# Patient Record
Sex: Female | Born: 1965 | Race: White | Hispanic: No | Marital: Married | Smoking: Former smoker
Health system: Southern US, Community
[De-identification: ages and names within clinical notes are randomized; demographics above are authoritative.]

## PROBLEM LIST (undated history)

## (undated) DIAGNOSIS — E785 Hyperlipidemia, unspecified: Secondary | ICD-10-CM

## (undated) DIAGNOSIS — I10 Essential (primary) hypertension: Secondary | ICD-10-CM

## (undated) DIAGNOSIS — Z9889 Other specified postprocedural states: Secondary | ICD-10-CM

## (undated) DIAGNOSIS — C801 Malignant (primary) neoplasm, unspecified: Secondary | ICD-10-CM

## (undated) DIAGNOSIS — C189 Malignant neoplasm of colon, unspecified: Secondary | ICD-10-CM

## (undated) DIAGNOSIS — I4891 Unspecified atrial fibrillation: Secondary | ICD-10-CM

## (undated) DIAGNOSIS — G4700 Insomnia, unspecified: Secondary | ICD-10-CM

## (undated) DIAGNOSIS — R519 Headache, unspecified: Secondary | ICD-10-CM

## (undated) DIAGNOSIS — R51 Headache: Secondary | ICD-10-CM

## (undated) DIAGNOSIS — R011 Cardiac murmur, unspecified: Secondary | ICD-10-CM

## (undated) DIAGNOSIS — F32A Depression, unspecified: Secondary | ICD-10-CM

## (undated) DIAGNOSIS — F329 Major depressive disorder, single episode, unspecified: Secondary | ICD-10-CM

## (undated) DIAGNOSIS — R112 Nausea with vomiting, unspecified: Secondary | ICD-10-CM

## (undated) HISTORY — PX: APPENDECTOMY: SHX54

## (undated) HISTORY — PX: COLON SURGERY: SHX602

## (undated) HISTORY — PX: TONSILLECTOMY: SUR1361

## (undated) HISTORY — DX: Unspecified atrial fibrillation: I48.91

## (undated) HISTORY — PX: INCISIONAL HERNIA REPAIR: SHX193

---

## 2004-06-30 ENCOUNTER — Ambulatory Visit: Payer: Self-pay | Admitting: Obstetrics and Gynecology

## 2005-07-03 ENCOUNTER — Ambulatory Visit: Payer: Self-pay | Admitting: Obstetrics and Gynecology

## 2006-07-05 ENCOUNTER — Ambulatory Visit: Payer: Self-pay | Admitting: Obstetrics and Gynecology

## 2007-07-08 ENCOUNTER — Ambulatory Visit: Payer: Self-pay | Admitting: Obstetrics and Gynecology

## 2008-07-09 ENCOUNTER — Ambulatory Visit: Payer: Self-pay | Admitting: Obstetrics and Gynecology

## 2009-07-12 ENCOUNTER — Ambulatory Visit: Payer: Self-pay | Admitting: Obstetrics and Gynecology

## 2010-07-15 ENCOUNTER — Ambulatory Visit: Payer: Self-pay | Admitting: Obstetrics and Gynecology

## 2011-07-18 ENCOUNTER — Ambulatory Visit: Payer: Self-pay | Admitting: Obstetrics and Gynecology

## 2012-08-28 ENCOUNTER — Ambulatory Visit: Payer: Self-pay | Admitting: Obstetrics and Gynecology

## 2012-11-21 ENCOUNTER — Ambulatory Visit: Payer: Self-pay | Admitting: Podiatry

## 2013-08-29 ENCOUNTER — Ambulatory Visit: Payer: Self-pay | Admitting: Obstetrics and Gynecology

## 2013-09-22 ENCOUNTER — Ambulatory Visit: Payer: Self-pay | Admitting: Gastroenterology

## 2013-09-24 LAB — PATHOLOGY REPORT

## 2013-10-24 DIAGNOSIS — IMO0002 Reserved for concepts with insufficient information to code with codable children: Secondary | ICD-10-CM | POA: Insufficient documentation

## 2013-10-24 DIAGNOSIS — C187 Malignant neoplasm of sigmoid colon: Secondary | ICD-10-CM | POA: Insufficient documentation

## 2013-10-30 DIAGNOSIS — R768 Other specified abnormal immunological findings in serum: Secondary | ICD-10-CM | POA: Insufficient documentation

## 2013-11-04 DIAGNOSIS — C187 Malignant neoplasm of sigmoid colon: Secondary | ICD-10-CM | POA: Insufficient documentation

## 2014-01-10 DIAGNOSIS — G43009 Migraine without aura, not intractable, without status migrainosus: Secondary | ICD-10-CM | POA: Insufficient documentation

## 2014-01-22 DIAGNOSIS — F321 Major depressive disorder, single episode, moderate: Secondary | ICD-10-CM | POA: Insufficient documentation

## 2014-01-23 HISTORY — PX: GASTRIC BYPASS: SHX52

## 2014-03-05 DIAGNOSIS — I493 Ventricular premature depolarization: Secondary | ICD-10-CM | POA: Insufficient documentation

## 2014-04-03 DIAGNOSIS — I10 Essential (primary) hypertension: Secondary | ICD-10-CM | POA: Insufficient documentation

## 2014-05-07 ENCOUNTER — Ambulatory Visit
Admit: 2014-05-07 | Disposition: A | Discharge status: Home / Self Care | Payer: Self-pay | Attending: Gastroenterology | Admitting: Gastroenterology

## 2014-05-15 ENCOUNTER — Ambulatory Visit
Admit: 2014-05-15 | Disposition: A | Discharge status: Home / Self Care | Payer: Self-pay | Attending: Gastroenterology | Admitting: Gastroenterology

## 2014-05-18 LAB — SURGICAL PATHOLOGY

## 2014-06-23 ENCOUNTER — Encounter
Admission: RE | Admit: 2014-06-23 | Discharge: 2014-06-23 | Disposition: A | Discharge status: Home / Self Care | Payer: BLUE CROSS/BLUE SHIELD | Source: Ambulatory Visit | Attending: Bariatrics | Admitting: Bariatrics

## 2014-06-23 DIAGNOSIS — Z01812 Encounter for preprocedural laboratory examination: Secondary | ICD-10-CM | POA: Insufficient documentation

## 2014-06-23 HISTORY — DX: Essential (primary) hypertension: I10

## 2014-06-23 HISTORY — DX: Malignant (primary) neoplasm, unspecified: C80.1

## 2014-06-23 HISTORY — DX: Cardiac murmur, unspecified: R01.1

## 2014-06-23 MED ORDER — APREPITANT 80 & 125 MG PO MISC: 125.0000 mg | Freq: Once | ORAL | Status: DC

## 2014-06-23 NOTE — Patient Instructions (Signed)
  Your procedure is scheduled on: June 30, 2014 Report to Same Day Surgery. To find out your arrival time please call [phone] between 1PM - 3PM on June 29, 2014.  Remember: Instructions that are not followed completely may result in serious medical risk, up to and including death, or upon the discretion of your surgeon and anesthesiologist your surgery may need to be rescheduled.    __x__ 1. Do not eat food or drink liquids after midnight. No gum chewing or hard candies.     __x__ 2. No Alcohol for 24 hours before or after surgery.   ____ 3. Bring all medications with you on the day of surgery if instructed.    __x__ 4. Notify your doctor if there is any change in your medical condition     (cold, fever, infections).     Do not wear jewelry, make-up, hairpins, clips or nail polish.  Do not wear lotions, powders, or perfumes. You may wear deodorant.  Do not shave 48 hours prior to surgery. Men may shave face and neck.  Do not bring valuables to the hospital.    [facility] is not responsible for any belongings or valuables.               Contacts, dentures or bridgework may not be worn into surgery.  Leave your suitcase in the car. After surgery it may be brought to your room.  For patients admitted to the hospital, discharge time is determined by your                treatment team.   Patients discharged the day of surgery will not be allowed to drive home.   Please read over the following fact sheets that you were given:   Surgical Site Infection Prevention   ____ Take these medicines the morning of surgery with A SIP OF WATER:    1. Metoprolol  2.   3.   4.  5.  6.  ____ Fleet Enema (as directed)   __x__ Use CHG Soap as directed  ____ Use inhalers on the day of surgery  ____ Stop metformin 2 days prior to surgery    ____ Take 1/2 of usual insulin dose the night before surgery and none on the morning of surgery.   ____ Stop Coumadin/Plavix/aspirin on   ____ Stop  Anti-inflammatories on    __x_ Stop supplements until after surgery.    ____ Bring C-Pap to the hospital.

## 2014-06-24 LAB — ABO/RH: ABO/RH(D): A POS

## 2014-06-29 MED ORDER — DEXTROSE 5 % IV SOLN
3.0000 g | Freq: Once | INTRAVENOUS | Status: AC
  Administered 2014-06-30: 3 g via INTRAVENOUS
  Filled 2014-06-29: qty 3000

## 2014-06-30 ENCOUNTER — Inpatient Hospital Stay: Payer: BLUE CROSS/BLUE SHIELD | Admitting: Anesthesiology

## 2014-06-30 ENCOUNTER — Encounter: Payer: Self-pay | Admitting: *Deleted

## 2014-06-30 ENCOUNTER — Inpatient Hospital Stay
Admission: RE | Admit: 2014-06-30 | Discharge: 2014-07-01 | DRG: 621 | Disposition: A | Discharge status: Home / Self Care | Payer: BLUE CROSS/BLUE SHIELD | Source: Ambulatory Visit | Attending: Bariatrics | Admitting: Bariatrics

## 2014-06-30 ENCOUNTER — Encounter
Admission: RE | Disposition: A | Discharge status: Home / Self Care | Payer: Self-pay | Source: Ambulatory Visit | Attending: Bariatrics

## 2014-06-30 DIAGNOSIS — I10 Essential (primary) hypertension: Secondary | ICD-10-CM | POA: Diagnosis present

## 2014-06-30 DIAGNOSIS — Z87891 Personal history of nicotine dependence: Secondary | ICD-10-CM

## 2014-06-30 DIAGNOSIS — Z85038 Personal history of other malignant neoplasm of large intestine: Secondary | ICD-10-CM

## 2014-06-30 HISTORY — PX: LAPAROSCOPIC GASTRIC RESTRICTIVE DUODENAL PROCEDURE (DUODENAL SWITCH): SHX6667

## 2014-06-30 LAB — TYPE AND SCREEN
ABO/RH(D): A POS
ANTIBODY SCREEN: POSITIVE
PT AG Type: POSITIVE
UNIT DIVISION: 0
Unit division: 0

## 2014-06-30 LAB — CREATININE, SERUM
Creatinine, Ser: 0.72 mg/dL (ref 0.44–1.00)
GFR calc non Af Amer: >60 mL/min (ref >60)

## 2014-06-30 SURGERY — LAPAROSCOPIC GASTRIC RESTRICTIVE DUODENAL PROCEDURE (DUODENAL SWITCH)
Anesthesia: General | Wound class: Contaminated

## 2014-06-30 MED ORDER — POTASSIUM CHLORIDE IN NACL 20-0.45 MEQ/L-% IV SOLN
INTRAVENOUS | Status: DC
  Administered 2014-06-30 – 2014-07-01 (×4): via INTRAVENOUS
  Filled 2014-06-30 (×6): qty 1000

## 2014-06-30 MED ORDER — UNJURY CHOCOLATE CLASSIC POWDER: 2.0000 [oz_av] | Freq: Four times a day (QID) | ORAL | Status: DC

## 2014-06-30 MED ORDER — UNJURY CHICKEN SOUP POWDER
2.0000 [oz_av] | Freq: Three times a day (TID) | ORAL | Status: DC
  Administered 2014-06-30: 2 [oz_av] via ORAL

## 2014-06-30 MED ORDER — MIDAZOLAM HCL 2 MG/2ML IJ SOLN
INTRAMUSCULAR | Status: DC | PRN
  Administered 2014-06-30 (×2): 1 mg via INTRAVENOUS
  Administered 2014-06-30: 2 mg via INTRAVENOUS

## 2014-06-30 MED ORDER — ONDANSETRON HCL 4 MG/2ML IJ SOLN
INTRAMUSCULAR | Status: DC | PRN
  Administered 2014-06-30: 4 mg via INTRAVENOUS

## 2014-06-30 MED ORDER — LACTATED RINGERS IV SOLN
INTRAVENOUS | Status: DC
  Administered 2014-06-30 (×2): via INTRAVENOUS

## 2014-06-30 MED ORDER — METOPROLOL TARTRATE 25 MG PO TABS
25.0000 mg | ORAL_TABLET | Freq: Two times a day (BID) | ORAL | Status: DC
  Administered 2014-07-01: 25 mg via ORAL
  Filled 2014-06-30: qty 1

## 2014-06-30 MED ORDER — ACETAMINOPHEN 10 MG/ML IV SOLN
INTRAVENOUS | Status: AC
  Filled 2014-06-30: qty 100

## 2014-06-30 MED ORDER — MORPHINE SULFATE 2 MG/ML IJ SOLN
2.0000 mg | INTRAMUSCULAR | Status: DC | PRN
  Administered 2014-06-30 – 2014-07-01 (×3): 4 mg via INTRAVENOUS
  Filled 2014-06-30: qty 2
  Filled 2014-06-30: qty 1
  Filled 2014-06-30 (×2): qty 2

## 2014-06-30 MED ORDER — ROCURONIUM BROMIDE 100 MG/10ML IV SOLN
INTRAVENOUS | Status: DC | PRN
  Administered 2014-06-30 (×2): 10 mg via INTRAVENOUS
  Administered 2014-06-30: 40 mg via INTRAVENOUS
  Administered 2014-06-30: 10 mg via INTRAVENOUS

## 2014-06-30 MED ORDER — SUGAMMADEX SODIUM 200 MG/2ML IV SOLN
INTRAVENOUS | Status: DC | PRN
  Administered 2014-06-30: 268.6 mg via INTRAVENOUS

## 2014-06-30 MED ORDER — PANTOPRAZOLE SODIUM 40 MG IV SOLR
40.0000 mg | Freq: Every day | INTRAVENOUS | Status: DC
  Administered 2014-06-30: 40 mg via INTRAVENOUS
  Filled 2014-06-30: qty 40

## 2014-06-30 MED ORDER — ACETAMINOPHEN 10 MG/ML IV SOLN
INTRAVENOUS | Status: DC | PRN
  Administered 2014-06-30: 1000 mg via INTRAVENOUS

## 2014-06-30 MED ORDER — PROPOFOL 10 MG/ML IV BOLUS
INTRAVENOUS | Status: DC | PRN
  Administered 2014-06-30: 150 mg via INTRAVENOUS
  Administered 2014-06-30: 50 mg via INTRAVENOUS

## 2014-06-30 MED ORDER — UNJURY VANILLA POWDER: 2.0000 [oz_av] | Freq: Four times a day (QID) | ORAL | Status: DC

## 2014-06-30 MED ORDER — BUPIVACAINE-EPINEPHRINE (PF) 0.25% -1:200000 IJ SOLN
INTRAMUSCULAR | Status: AC
  Filled 2014-06-30: qty 60

## 2014-06-30 MED ORDER — ENOXAPARIN SODIUM 40 MG/0.4ML ~~LOC~~ SOLN: 40.0000 mg | SUBCUTANEOUS | Status: DC

## 2014-06-30 MED ORDER — UNJURY CHICKEN SOUP POWDER: 2.0000 [oz_av] | Freq: Four times a day (QID) | ORAL | Status: DC

## 2014-06-30 MED ORDER — SODIUM CHLORIDE 0.9 % IR SOLN
Status: DC | PRN
  Administered 2014-06-30: 350 mL

## 2014-06-30 MED ORDER — BUPIVACAINE-EPINEPHRINE (PF) 0.25% -1:200000 IJ SOLN
INTRAMUSCULAR | Status: DC | PRN
  Administered 2014-06-30: 50 mL

## 2014-06-30 MED ORDER — HYDROMORPHONE HCL 1 MG/ML IJ SOLN
0.2500 mg | INTRAMUSCULAR | Status: DC | PRN
  Administered 2014-06-30 (×4): 0.25 mg via INTRAVENOUS

## 2014-06-30 MED ORDER — FENTANYL CITRATE (PF) 100 MCG/2ML IJ SOLN
25.0000 ug | INTRAMUSCULAR | Status: DC | PRN
  Administered 2014-06-30 (×4): 25 ug via INTRAVENOUS

## 2014-06-30 MED ORDER — GLYCOPYRROLATE 0.2 MG/ML IJ SOLN
INTRAMUSCULAR | Status: DC | PRN
  Administered 2014-06-30: 0.2 mg via INTRAVENOUS

## 2014-06-30 MED ORDER — HYDROMORPHONE HCL 1 MG/ML IJ SOLN
INTRAMUSCULAR | Status: AC
  Administered 2014-06-30: 0.25 mg via INTRAVENOUS
  Filled 2014-06-30: qty 1

## 2014-06-30 MED ORDER — ACETAMINOPHEN 160 MG/5ML PO SOLN: 650.0000 mg | ORAL | Status: DC | PRN

## 2014-06-30 MED ORDER — OXYCODONE HCL 5 MG/5ML PO SOLN
5.0000 mg | ORAL | Status: DC | PRN
  Administered 2014-07-01: 5 mg via ORAL
  Filled 2014-06-30: qty 5

## 2014-06-30 MED ORDER — FENTANYL CITRATE (PF) 100 MCG/2ML IJ SOLN
INTRAMUSCULAR | Status: DC | PRN
  Administered 2014-06-30: 50 ug via INTRAVENOUS
  Administered 2014-06-30: 150 ug via INTRAVENOUS
  Administered 2014-06-30: 50 ug via INTRAVENOUS

## 2014-06-30 MED ORDER — ONDANSETRON HCL 4 MG/2ML IJ SOLN: 4.0000 mg | INTRAMUSCULAR | Status: DC | PRN

## 2014-06-30 MED ORDER — FENTANYL CITRATE (PF) 100 MCG/2ML IJ SOLN
INTRAMUSCULAR | Status: AC
  Filled 2014-06-30: qty 2

## 2014-06-30 MED ORDER — DULOXETINE HCL 60 MG PO CPEP
60.0000 mg | ORAL_CAPSULE | Freq: Every day | ORAL | Status: DC
  Administered 2014-07-01: 60 mg via ORAL
  Filled 2014-06-30: qty 1

## 2014-06-30 MED ORDER — ONDANSETRON HCL 4 MG/2ML IJ SOLN: 4.0000 mg | Freq: Once | INTRAMUSCULAR | Status: DC | PRN

## 2014-06-30 MED ORDER — BUPIVACAINE HCL (PF) 0.25 % IJ SOLN
INTRAMUSCULAR | Status: AC
  Filled 2014-06-30: qty 30

## 2014-06-30 MED ORDER — ENOXAPARIN SODIUM 40 MG/0.4ML ~~LOC~~ SOLN
40.0000 mg | Freq: Two times a day (BID) | SUBCUTANEOUS | Status: DC
  Administered 2014-07-01: 40 mg via SUBCUTANEOUS
  Filled 2014-06-30: qty 0.4

## 2014-06-30 MED ORDER — ACETAMINOPHEN 160 MG/5ML PO SOLN
325.0000 mg | ORAL | Status: DC | PRN
  Filled 2014-06-30: qty 20.3

## 2014-06-30 SURGICAL SUPPLY — 46 items
APPLIER CLIP ROT 10 11.4 M/L (STAPLE)
BANDAGE ELASTIC 6 CLIP NS LF (GAUZE/BANDAGES/DRESSINGS) ×6 IMPLANT
CANISTER SUCT 1200ML W/VALVE (MISCELLANEOUS) ×3 IMPLANT
CATH TRAY 16F METER LATEX (MISCELLANEOUS) ×3 IMPLANT
CHLORAPREP W/TINT 26ML (MISCELLANEOUS) ×6 IMPLANT
CLIP APPLIE ROT 10 11.4 M/L (STAPLE) IMPLANT
DEFOGGER SCOPE WARMER CLEARIFY (MISCELLANEOUS) ×3 IMPLANT
DRAPE UTILITY 15X26 TOWEL STRL (DRAPES) ×6 IMPLANT
ENDO 360 STITCH ×3 IMPLANT
FILTER LAP SMOKE EVAC STRL (MISCELLANEOUS) ×3 IMPLANT
GLOVE BIO SURGEON STRL SZ7 (GLOVE) ×18 IMPLANT
GOWN STRL REUS W/ TWL LRG LVL3 (GOWN DISPOSABLE) ×4 IMPLANT
GOWN STRL REUS W/TWL LRG LVL3 (GOWN DISPOSABLE) ×8
GRASPER SUT TROCAR 14GX15 (MISCELLANEOUS) IMPLANT
IRRIGATION STRYKERFLOW (MISCELLANEOUS) ×1 IMPLANT
IRRIGATOR STRYKERFLOW (MISCELLANEOUS) ×3
IV NS 1000ML (IV SOLUTION) ×2
IV NS 1000ML BAXH (IV SOLUTION) ×1 IMPLANT
KIT RM TURNOVER STRD PROC AR (KITS) ×3 IMPLANT
LABEL OR SOLS (LABEL) ×3 IMPLANT
LIQUID BAND (GAUZE/BANDAGES/DRESSINGS) ×3 IMPLANT
NDL SAFETY 22GX1.5 (NEEDLE) ×3 IMPLANT
NS IRRIG 500ML POUR BTL (IV SOLUTION) ×3 IMPLANT
PACK LAP CHOLECYSTECTOMY (MISCELLANEOUS) ×3 IMPLANT
RELOAD BLUE (STAPLE) ×3 IMPLANT
RELOAD GOLD (STAPLE) ×9 IMPLANT
RELOAD GREEN (STAPLE) ×3 IMPLANT
RELOAD STAPLER 60MM BLK (STAPLE) ×1 IMPLANT
RELOAD STAPLER BLUE 60MM (STAPLE) ×2 IMPLANT
SHEARS HARMONIC ACE PLUS 45CM (MISCELLANEOUS) ×3 IMPLANT
SLEEVE ENDOPATH XCEL 5M (ENDOMECHANICALS) ×9 IMPLANT
SLEEVE GASTRECTOMY 36FR VISIGI (MISCELLANEOUS) IMPLANT
STAPLER ECHELON LONG 60 440 (INSTRUMENTS) ×3 IMPLANT
STAPLER RELOAD 60MM BLK (STAPLE) ×3
STAPLER RELOAD BLUE 60MM (STAPLE) ×6
SUT DEVICE BRAIDED 0X39 (SUTURE) IMPLANT
SUT DEVICE BRAIDED 2.0X39 (SUTURE) ×12 IMPLANT
SUT DVC ABSORB BRAID 3.0X39 (SUTURE) ×9 IMPLANT
SUT DVC VICRYL PGA 2.0X39 (SUTURE) ×3 IMPLANT
SUT MNCRL AB 4-0 PS2 18 (SUTURE) ×3 IMPLANT
SUT VIC AB 0 CT2 27 (SUTURE) ×6 IMPLANT
SYR 20CC LL (SYRINGE) ×3 IMPLANT
TROCAR BLADELESS 15MM (ENDOMECHANICALS) ×3 IMPLANT
TROCAR XCEL 12X100 BLDLESS (ENDOMECHANICALS) ×3 IMPLANT
TROCAR XCEL NON-BLD 5MMX100MML (ENDOMECHANICALS) ×3 IMPLANT
TUBING INSUFFLATOR HEATED (MISCELLANEOUS) ×3 IMPLANT

## 2014-06-30 NOTE — Progress Notes (Signed)
ANTICOAGULATION CONSULT NOTE - Initial Consult  Pharmacy Consult for Lovenox Indication: DVT and VTE prophylaxis  Allergies  Allergen Reactions  . Prednisone     depression  . Voltaren [Diclofenac Sodium] Other (See Comments)    tachycardia    Patient Measurements: Height: 5\' 4"  (162.6 cm) Weight: (!) 309 lb 6.4 oz (140.343 kg) IBW/kg (Calculated) : 54.7 Heparin Dosing Weight:   Vital Signs: Temp: 98.2 F (36.8 C) (06/07 1700) Temp Source: Oral (06/07 1700) BP: 162/85 mmHg (06/07 1700) Pulse Rate: 74 (06/07 1700)  Labs:  Recent Labs  06/30/14 1732  CREATININE 0.72    Estimated Creatinine Clearance: 118.1 mL/min (by C-G formula based on Cr of 0.72).   Medical History: Past Medical History  Diagnosis Date  . Hypertension   . Heart murmur   . Cancer     colon    Medications:  Prescriptions prior to admission  Medication Sig Dispense Refill Last Dose  . DULoxetine (CYMBALTA) 60 MG capsule Take 60 mg by mouth daily.   06/29/2014 at Unknown time  . metoprolol (LOPRESSOR) 50 MG tablet Take 50 mg by mouth 2 (two) times daily.   06/30/2014 at 0600  . montelukast (SINGULAIR) 10 MG tablet Take 10 mg by mouth daily as needed (allergies).   Past Week at Unknown time  . Multiple Vitamin (MULTIVITAMIN) tablet Take 1 tablet by mouth daily.   Unknown at Unknown  . norethindrone-ethinyl estradiol (OVCON-35,BALZIVA,BRIELLYN) 0.4-35 MG-MCG tablet Take 1 tablet by mouth daily.   06/29/2014 at Unknown time    Assessment: BMI > 40   Goal of Therapy:  DVT prophylaxis     Plan:  Lovenox 40 mg SQ Q24H originally ordered.  Will increase dose to lovenox 40 mg SQ Q12H based on BMI > 40.   Daryle Amis D 06/30/2014,6:37 PM

## 2014-06-30 NOTE — Brief Op Note (Addendum)
06/30/2014  11:27 AM  PATIENT:  Jessica Moody  49 y.o. female  PRE-OPERATIVE DIAGNOSIS:  MORBID OBESITY  POST-OPERATIVE DIAGNOSIS:  Morbid obesityPROCEDURE:  Procedure(s): LAPAROSCOPIC GASTRIC RESTRICTIVE DUODENAL PROCEDURE (DUODENAL SWITCH) (N/A)  SURGEON:  Surgeon(s) and Role:    * [name], MD - Primary  PHYSICIAN ASSISTANT: Liliane Bade, PA      ANESTHESIA:   general  EBL:  Less than 5oml  BLOOD ADMINISTERED:none  DRAINS: none   LOCAL MEDICATIONS USED:  MARCAINE     SPECIMEN:  Source of Specimen:  lateral stomach  DISPOSITION OF SPECIMEN:  PATHOLOGY  COUNTS:  YES  TOURNIQUET:  * No tourniquets in log *  DICTATION: .Note written in paper chart  PLAN OF CARE: Admit to inpatient   PATIENT DISPOSITION:  PACU - hemodynamically stable.   Delay start of Pharmacological VTE agent (>24hrs) due to surgical blood loss or risk of bleeding:minimal

## 2014-06-30 NOTE — Anesthesia Preprocedure Evaluation (Signed)
Anesthesia Evaluation  Patient identified by MRN, date of birth, ID band Patient awake    Reviewed: Allergy & Precautions, NPO status , Patient's Chart, lab work & pertinent test results, reviewed documented beta blocker date and time   History of Anesthesia Complications Negative for: history of anesthetic complications  Airway Mallampati: I  TM Distance: >3 FB Neck ROM: Full    Dental no notable dental hx.    Pulmonary neg pulmonary ROS, former smoker,  breath sounds clear to auscultation  Pulmonary exam normal       Cardiovascular Exercise Tolerance: Good hypertension, Pt. on medications and Pt. on home beta blockers Normal cardiovascular exam+ Valvular Problems/Murmurs Rhythm:Regular Rate:Normal     Neuro/Psych negative neurological ROS  negative psych ROS   GI/Hepatic negative GI ROS, Neg liver ROS,   Endo/Other  negative endocrine ROS  Renal/GU negative Renal ROS  negative genitourinary   Musculoskeletal negative musculoskeletal ROS (+)   Abdominal   Peds negative pediatric ROS (+)  Hematology negative hematology ROS (+)   Anesthesia Other Findings   Reproductive/Obstetrics negative OB ROS                             Anesthesia Physical Anesthesia Plan  ASA: III  Anesthesia Plan: General   Post-op Pain Management:    Induction: Intravenous  Airway Management Planned: Oral ETT  Additional Equipment:   Intra-op Plan:   Post-operative Plan: Extubation in OR  Informed Consent: I have reviewed the patients History and Physical, chart, labs and discussed the procedure including the risks, benefits and alternatives for the proposed anesthesia with the patient or authorized representative who has indicated his/her understanding and acceptance.   Dental advisory given  Plan Discussed with: CRNA and Surgeon  Anesthesia Plan Comments:         Anesthesia Quick  Evaluation

## 2014-06-30 NOTE — H&P (Signed)
History and physical on paper chart, no change in either history or physical 

## 2014-06-30 NOTE — Progress Notes (Addendum)
Patient ambulated around the unit times three with no shortness of breath or dizziness. Patient tolerated ambulation well.  It even relieved her pain in the left shoulder.  Jessica Moody  06/30/2014  11:34 PM

## 2014-06-30 NOTE — Transfer of Care (Signed)
Immediate Anesthesia Transfer of Care Note  Patient: Jessica Moody  Procedure(s) Performed: Procedure(s): LAPAROSCOPIC DUODENAL SWITCH WTIH BILIARY PANCREATIC DIVERSION  (N/A)  Patient Location: PACU  Anesthesia Type:General  Level of Consciousness: awake, alert  and oriented  Airway & Oxygen Therapy: Patient Spontanous Breathing and Patient connected to face mask oxygen  Post-op Assessment: Report given to RN and Post -op Vital signs reviewed and stable  Post vital signs: Reviewed and stable  Last Vitals:  Filed Vitals:   06/30/14 1434  BP: 139/71  Pulse: 84  Temp: 37.2 C  Resp:     Complications: No apparent anesthesia complications

## 2014-06-30 NOTE — Op Note (Signed)
PATIENT: Jessica REDDOCH 04-08-1965  PROCEDURE PERFORMED: laparoscopic DS with BPD PRE-OP DIAGNOSIS: MORBID OBESITY POST-OP DIAGNOSIS:Morbid obesity ESTIMATED BLOOD LOSS: Minimal SURGEON: [name]  ASSISTANT: Liliane Bade, PA  PROCEDURE NOTE: The patient was brought to the operating room and placed in the supine position. General anesthesia was obtained with orotracheal intubation. Foley catheter inserted sterilely. TED hose and Thromboguards applied. A foot board applied at the enatd of the operative bed. The chest and abdomen were sterilely prepped and draped. A 5 mm Optiview trocar introduced under direct visualization in the left upper quadrant of the abdomen. Pneumoperitoneum obtained. Four additional trocars introduced across the upper abdomen. The cecum and distal ileum identified. The small bowel was then followed approximate 300 cm, at which point it was secured to the gastrocolic ligament. A Nathanson liver retractor was introduced followed by elevation of the left lobe of the liver. . The patient then had identification and marking of the pylorus. Beginning approximately 4 cm proximal to this there was division of vascular pedicles along the greater curvature of the stomach, this was extended cephalad over a distance of approximately 8 cm. Creation of a gastric sleeve effect was then initiated. The patient had a series of GI staple firings placed creating a medially based gastric tube effect. The first firing was a black load stapler placed in a relative transverse direction as was the next gold load firing. These were positioned in a way to avoid narrowing in the region of the incisura. Next a more vertical line of staples was placed parallel to the lesser curvature and ultimately brought out just lateral to the angle of His. As this was done the 32 [place] ViSiGi device which was placed in the antral region was gradually withdrawn to helped create a consistent tubular effect. The lateral  stomach then freed from the gastrocolic and gastrosplenic ligament and removed via the right upper quadrant 12 mm trocar site. The residual vascular pedicles along the lower inner curve of the duodenum bulb were divided by use of the Harmonic scalpel, the peritoneum being scored immediately before this and also the lateral peritoneum also scored. Blunt dissection behind the duodenum divided by use of the Harmonic scalpel. This was ultimately brought out lateral to the duodenum, approximately 3 cm inferior to the pylorus. Because of small bowel bleeding that was difficult to expose it was decided to complete mobilization of the inner curve of the lower stomach and most proximal aspect of the duodenum. This was again done with use of the Harmonic scalpel, a small bleeding point controlled by application of a 5 mm clip applier. The patient then had a blue load stapler introduced across the duodenum again approximately 3 cm inferior to the pylorus. This was used to divide the duodenum with excellent hemostasis. Several residual vascular pedicles on the lateral aspect of the duodenum divided, allowing this to be brought more to the area of the midline. The patient then had an ileal-duodenal anastomosis constructed. This was done with securing seromuscular layers of the distal duodenal staple line area and the antimesenteric border of the ileum. Enterotomies then made on the anterior aspect of the duodenum and opposing portion of the ileum. A full-thickness running 3-0 Polysorb suture used to complete the anastomosis, the suture line then reinforced anteriorly with an additional running 2-0 Polysorb suture. The intestine was occluded distally and insufflation of the gastric tube in area of anastomosis performed. A saline bath performed, no air leak identified. At this point the [place]  device was withdrawn.  The jejunum immediate proximal to the duodenal anastomosis was divided with white load gia stapler along with  partial division of mesentery with harmonic scalpel. The alimentary limb was followed distally 50 cm at which point a jejunal jejunal anastomosis created between the new biliary limb and the common channel. This was created by an enterotomy on the antimesenteric margin of each portions of bowel followed by a white load staple firing at 35 mm to creat a common lumen. The resulting enterotomy was closed with a white load staple. Antitorsion sutures placed distal to the anastomosis and the mesenteric window closed with 2.0 surgidec suture.A portion of the divided gastrocolic ligament was secured to the lateral margins of the gastric sleeve to assure a consistent, somewhat rounded entry into the distal stomach. At this point the fascia and peritoneum of the 12 mm trocar site was closed with 0 Vicryl suture as passed by a needle suture passing system under direct visualization. The pneumoperitoneum relieved. The trocars removed. The wounds injected with 0.25% Marcaine and closed with 4-0 Monocryl in the dermis followed by Dermabond.

## 2014-06-30 NOTE — Anesthesia Postprocedure Evaluation (Signed)
  Anesthesia Post-op Note  Patient: Jessica Moody  Procedure(s) Performed: Procedure(s): LAPAROSCOPIC DUODENAL SWITCH WTIH BILIARY PANCREATIC DIVERSION  (N/A)  Anesthesia type:General  Patient location: PACU  Post pain: Pain level controlled  Post assessment: Post-op Vital signs reviewed, Patient's Cardiovascular Status Stable, Respiratory Function Stable, Patent Airway and No signs of Nausea or vomiting  Post vital signs: Reviewed and stable  Last Vitals:  Filed Vitals:   06/30/14 1434  BP: 139/71  Pulse: 84  Temp: 37.2 C  Resp:     Level of consciousness: awake, alert  and patient cooperative  Complications: No apparent anesthesia complications  Sugammedex used, pt counseled on need for barrier contraceptives for next two weeks

## 2014-06-30 NOTE — Interval H&P Note (Signed)
History and Physical Interval Note:  06/30/2014 10:54 AM  Jessica Moody  has presented today for surgery, with the diagnosis of MORBID OBESITY  The various methods of treatment have been discussed with the patient and family. After consideration of risks, benefits and other options for treatment, the patient has consented to  Procedure(s): LAPAROSCOPIC GASTRIC RESTRICTIVE DUODENAL PROCEDURE (DUODENAL SWITCH) (N/A) as a surgical intervention .  The patient's history has been reviewed, patient examined, no change in status, stable for surgery.  I have reviewed the patient's chart and labs.  Questions were answered to the patient's satisfaction.     [name]

## 2014-06-30 NOTE — Anesthesia Procedure Notes (Signed)
Procedure Name: Intubation Date/Time: 06/30/2014 11:17 AM Performed by: Jonna Clark Pre-anesthesia Checklist: Patient identified, Emergency Drugs available, Suction available, Patient being monitored and Timeout performed Patient Re-evaluated:Patient Re-evaluated prior to inductionOxygen Delivery Method: Circle system utilized Preoxygenation: Pre-oxygenation with 100% oxygen Intubation Type: IV induction Ventilation: Mask ventilation without difficulty Laryngoscope Size: Mac and 3 Grade View: Grade I Tube type: Oral Tube size: 7.0 mm Number of attempts: 1 Placement Confirmation: ETT inserted through vocal cords under direct vision,  positive ETCO2 and breath sounds checked- equal and bilateral Secured at: 22 cm Tube secured with: Tape Dental Injury: Teeth and Oropharynx as per pre-operative assessment

## 2014-07-01 ENCOUNTER — Encounter: Payer: Self-pay | Admitting: Bariatrics

## 2014-07-01 LAB — COMPREHENSIVE METABOLIC PANEL
ALT: 47 U/L (ref 14–54)
AST: 49 U/L — ABNORMAL HIGH (ref 15–41)
Albumin: 2.9 g/dL — ABNORMAL LOW (ref 3.5–5.0)
Alkaline Phosphatase: 46 U/L (ref 38–126)
Anion gap: 9 (ref 5–15)
BUN: 8 mg/dL (ref 6–20)
CALCIUM: 8.3 mg/dL — AB (ref 8.9–10.3)
CO2: 25 mmol/L (ref 22–32)
CREATININE: 0.59 mg/dL (ref 0.44–1.00)
Chloride: 106 mmol/L (ref 101–111)
GFR calc Af Amer: >60 mL/min (ref >60)
GFR calc non Af Amer: >60 mL/min (ref >60)
GLUCOSE: 117 mg/dL — AB (ref 65–99)
Potassium: 4.1 mmol/L (ref 3.5–5.1)
Sodium: 140 mmol/L (ref 135–145)
TOTAL PROTEIN: 6.4 g/dL — AB (ref 6.5–8.1)
Total Bilirubin: 0.5 mg/dL (ref 0.3–1.2)

## 2014-07-01 LAB — CBC WITH DIFFERENTIAL/PLATELET
Basophils Absolute: 0 10*3/uL (ref 0–0.1)
Basophils Relative: 0 %
Eosinophils Absolute: 0 10*3/uL (ref 0–0.7)
Eosinophils Relative: 0 %
HCT: 36.1 % (ref 35.0–47.0)
Hemoglobin: 12.2 g/dL (ref 12.0–16.0)
Lymphocytes Relative: 12 %
Lymphs Abs: 1.1 10*3/uL (ref 1.0–3.6)
MCH: 28.8 pg (ref 26.0–34.0)
MCHC: 33.8 g/dL (ref 32.0–36.0)
MCV: 85 fL (ref 80.0–100.0)
MONOS PCT: 5 %
Monocytes Absolute: 0.5 10*3/uL (ref 0.2–0.9)
Neutro Abs: 7.5 10*3/uL — ABNORMAL HIGH (ref 1.4–6.5)
Neutrophils Relative %: 83 %
Platelets: 298 10*3/uL (ref 150–440)
RBC: 4.25 MIL/uL (ref 3.80–5.20)
RDW: 13.3 % (ref 11.5–14.5)
WBC: 9 10*3/uL (ref 3.6–11.0)

## 2014-07-01 NOTE — Progress Notes (Signed)
Initial Nutrition Assessment  INTERVENTION:  RD consulted for nutrition education regarding inpatient bariatric surgery.   RD provided "The Liquid Diet" handout from the Bariatric Surgery Guide from the [facility] [place] [facility]. This handout previously provided to patient prior to surgery is a duplicate copy. Discussed what foods/liquids are consistent with a Clear Liquid Diet and reinforced Key Concepts such as no carbonation, no caffeine, or sugar containing beverages. Provided methods to prevent dehydration and promote protein intake, using clock and sample fluid schedule. RD encouraged follow-up with outpatient dietitian after discharge.  Teach back method used.  Expect good compliance.  NUTRITION DIAGNOSIS:  Food and nutrition knowledge related deficit related to recent bariatric surgery as evidenced by dietitian consult for nutrition education   GOAL:  Patient will be able to sip and tolerate CL within 24-48 hours  MONITOR:  Energy intake Digestive system  ASSESSMENT:  Pt s/p lap duodenal switch with biliary pancreatic diversion yesterday.   Body mass index is 53.08 kg/(m^2).   Current diet order is Bariatric Clear Liquids with unjury supplement TID, patient is consuming approximately 1-2oz every 20 mins at this time.   Labs and medications reviewed.   LOW Care Level  Jessica Moody, [place], [place] Pager [phone]

## 2014-07-01 NOTE — Progress Notes (Addendum)
Patient discharged to home as ordered. Patient instructed to continue diet as ordered. Patient already has prescriptions IV dc 'd site without S/s of infiltration or infection. Telemetry dc 'd. Family at the bedside to take patient home. Patient also given discharge instructions. Patient is alert and oriented. Ambulating without assistance.

## 2014-07-02 LAB — SURGICAL PATHOLOGY

## 2014-07-03 ENCOUNTER — Encounter: Payer: Self-pay | Admitting: Bariatrics

## 2014-07-08 LAB — POCT PREGNANCY, URINE: Preg Test, Ur: NEGATIVE

## 2014-07-11 LAB — TYPE AND SCREEN
ABO/RH(D): A POS
ANTIBODY SCREEN: POSITIVE
PT AG TYPE: POSITIVE
UNIT DIVISION: 0
UNIT DIVISION: 0

## 2014-09-02 DIAGNOSIS — Z9884 Bariatric surgery status: Secondary | ICD-10-CM | POA: Insufficient documentation

## 2014-11-30 ENCOUNTER — Ambulatory Visit: Payer: Managed Care, Other (non HMO) | Admitting: Certified Registered"

## 2014-11-30 ENCOUNTER — Encounter
Admission: RE | Disposition: A | Discharge status: Home / Self Care | Payer: Self-pay | Source: Ambulatory Visit | Attending: Gastroenterology

## 2014-11-30 ENCOUNTER — Ambulatory Visit
Admission: RE | Admit: 2014-11-30 | Discharge: 2014-11-30 | Disposition: A | Discharge status: Home / Self Care | Payer: Managed Care, Other (non HMO) | Source: Ambulatory Visit | Attending: Gastroenterology | Admitting: Gastroenterology

## 2014-11-30 DIAGNOSIS — E785 Hyperlipidemia, unspecified: Secondary | ICD-10-CM | POA: Diagnosis not present

## 2014-11-30 DIAGNOSIS — I10 Essential (primary) hypertension: Secondary | ICD-10-CM | POA: Diagnosis not present

## 2014-11-30 DIAGNOSIS — Z85038 Personal history of other malignant neoplasm of large intestine: Secondary | ICD-10-CM | POA: Diagnosis not present

## 2014-11-30 DIAGNOSIS — Z08 Encounter for follow-up examination after completed treatment for malignant neoplasm: Secondary | ICD-10-CM | POA: Diagnosis not present

## 2014-11-30 DIAGNOSIS — Z79899 Other long term (current) drug therapy: Secondary | ICD-10-CM | POA: Insufficient documentation

## 2014-11-30 DIAGNOSIS — Z833 Family history of diabetes mellitus: Secondary | ICD-10-CM | POA: Insufficient documentation

## 2014-11-30 DIAGNOSIS — Z87891 Personal history of nicotine dependence: Secondary | ICD-10-CM | POA: Insufficient documentation

## 2014-11-30 DIAGNOSIS — K6389 Other specified diseases of intestine: Secondary | ICD-10-CM | POA: Insufficient documentation

## 2014-11-30 DIAGNOSIS — F329 Major depressive disorder, single episode, unspecified: Secondary | ICD-10-CM | POA: Diagnosis not present

## 2014-11-30 DIAGNOSIS — E669 Obesity, unspecified: Secondary | ICD-10-CM | POA: Diagnosis not present

## 2014-11-30 DIAGNOSIS — Z8249 Family history of ischemic heart disease and other diseases of the circulatory system: Secondary | ICD-10-CM | POA: Insufficient documentation

## 2014-11-30 HISTORY — DX: Headache: R51

## 2014-11-30 HISTORY — DX: Nausea with vomiting, unspecified: R11.2

## 2014-11-30 HISTORY — DX: Major depressive disorder, single episode, unspecified: F32.9

## 2014-11-30 HISTORY — DX: Insomnia, unspecified: G47.00

## 2014-11-30 HISTORY — DX: Other specified postprocedural states: Z98.890

## 2014-11-30 HISTORY — DX: Depression, unspecified: F32.A

## 2014-11-30 HISTORY — DX: Headache, unspecified: R51.9

## 2014-11-30 HISTORY — PX: COLONOSCOPY WITH PROPOFOL: SHX5780

## 2014-11-30 HISTORY — DX: Hyperlipidemia, unspecified: E78.5

## 2014-11-30 LAB — POCT PREGNANCY, URINE: PREG TEST UR: NEGATIVE

## 2014-11-30 SURGERY — COLONOSCOPY WITH PROPOFOL
Anesthesia: General

## 2014-11-30 MED ORDER — PROPOFOL 10 MG/ML IV BOLUS
INTRAVENOUS | Status: DC | PRN
  Administered 2014-11-30: 70 mg via INTRAVENOUS
  Administered 2014-11-30: 20 mg via INTRAVENOUS
  Administered 2014-11-30: 30 mg via INTRAVENOUS
  Administered 2014-11-30: 20 mg via INTRAVENOUS

## 2014-11-30 MED ORDER — SODIUM CHLORIDE 0.9 % IV SOLN
INTRAVENOUS | Status: DC
  Administered 2014-11-30: 1000 mL via INTRAVENOUS

## 2014-11-30 MED ORDER — LIDOCAINE HCL (CARDIAC) 20 MG/ML IV SOLN
INTRAVENOUS | Status: DC | PRN
  Administered 2014-11-30: 50 mg via INTRAVENOUS

## 2014-11-30 MED ORDER — PROPOFOL 500 MG/50ML IV EMUL
INTRAVENOUS | Status: DC | PRN
  Administered 2014-11-30: 120 ug/kg/min via INTRAVENOUS

## 2014-11-30 NOTE — H&P (Signed)
Primary Care Physician:  Kirk Ruths., MD  Pre-Procedure History & Physical: HPI:  Jessica Moody is a 49 y.o. female is here for an colonoscopy.   Past Medical History  Diagnosis Date  . Hypertension   . Heart murmur   . Cancer ([place])     colon  . Depression   . Gastritis   . Headache   . PONV (postoperative nausea and vomiting)   . Obesity   . Insomnia   . Hyperlipemia     Past Surgical History  Procedure Laterality Date  . Colon surgery    . Tonsillectomy    . Appendectomy    . Cesarean section N/A     C-Section X 2  . Laparoscopic gastric restrictive duodenal procedure (duodenal switch) N/A 06/30/2014    Procedure: LAPAROSCOPIC DUODENAL SWITCH WTIH BILIARY PANCREATIC DIVERSION ;  Surgeon: [name], MD;  Location: [facility] ORS;  Service: General;  Laterality: N/A;    Prior to Admission medications   Medication Sig Start Date End Date Taking? Authorizing Provider  colestipol (COLESTID) 1 G tablet Take 1 g by mouth 2 (two) times daily.   Yes Historical Provider, MD  ferrous fumarate (HEMOCYTE - 106 MG FE) 325 (106 FE) MG TABS tablet Take 1 tablet by mouth daily.   Yes Historical Provider, MD  HYDROcodone-acetaminophen (NORCO) 7.5-325 MG tablet Take 1 tablet by mouth every 6 (six) hours as needed for moderate pain.   Yes Historical Provider, MD  Lactobacillus Acidophilus POWD 1 tablet by Does not apply route daily.   Yes Historical Provider, MD  lipase/protease/amylase (CREON) 12000 UNITS CPEP capsule Take 24,000 Units by mouth 3 (three) times daily before meals.   Yes Historical Provider, MD  omeprazole (PRILOSEC) 20 MG capsule Take 20 mg by mouth daily.   Yes Historical Provider, MD  ondansetron (ZOFRAN-ODT) 8 MG disintegrating tablet Take 8 mg by mouth every 8 (eight) hours as needed for nausea or vomiting.   Yes Historical Provider, MD  potassium chloride SA (K-DUR,KLOR-CON) 20 MEQ tablet Take 20 mEq by mouth 2 (two) times daily.   Yes Historical Provider, MD   DULoxetine (CYMBALTA) 60 MG capsule Take 60 mg by mouth daily.    Historical Provider, MD  metoprolol (LOPRESSOR) 50 MG tablet Take 50 mg by mouth 2 (two) times daily.    Historical Provider, MD  montelukast (SINGULAIR) 10 MG tablet Take 10 mg by mouth daily as needed (allergies).    Historical Provider, MD  Multiple Vitamin (MULTIVITAMIN) tablet Take 1 tablet by mouth daily.    Historical Provider, MD  norethindrone-ethinyl estradiol (OVCON-35,BALZIVA,BRIELLYN) 0.4-35 MG-MCG tablet Take 1 tablet by mouth daily.    Historical Provider, MD    Allergies as of 10/01/2014 - Review Complete 06/30/2014  Allergen Reaction Noted  . Prednisone  06/23/2014  . Voltaren [diclofenac sodium] Other (See Comments) 06/23/2014    Family History  Problem Relation Age of Onset  . Diabetes type II Mother   . Hypertension Mother   . Heart failure Mother   . Kidney disease Mother     Social History   Social History  . Marital Status: Married    Spouse Name: N/A  . Number of Children: N/A  . Years of Education: N/A   Occupational History  . Not on file.   Social History Main Topics  . Smoking status: Former Smoker -- 1.00 packs/day    Types: Cigarettes    Quit date: 08/24/1998  . Smokeless tobacco: Never Used  .  Alcohol Use: No  . Drug Use: No  . Sexual Activity: Not on file   Other Topics Concern  . Not on file   Social History Narrative     Physical Exam: BP 142/69 mmHg  Pulse 59  Temp(Src) 98 F (36.7 C) (Tympanic)  Resp 19  Ht 5\' 4"  (1.626 m)  Wt 97.977 kg (216 lb)  BMI 37.06 kg/m2  SpO2 100% General:   Alert,  pleasant and cooperative in NAD Head:  Normocephalic and atraumatic. Neck:  Supple; no masses or thyromegaly. Lungs:  Clear throughout to auscultation.    Heart:  Regular rate and rhythm. Abdomen:  Soft, nontender and nondistended. Normal bowel sounds, without guarding, and without rebound.   Neurologic:  Alert and  oriented x4;  grossly normal  neurologically.  Impression/Plan: Jessica Moody is here for an colonoscopy to be performed for personal hx colon cancer  Risks, benefits, limitations, and alternatives regarding  colonoscopy have been reviewed with the patient.  Questions have been answered.  All parties agreeable.   Josefine Class, MD  11/30/2014, 11:27 AM

## 2014-11-30 NOTE — Anesthesia Postprocedure Evaluation (Signed)
  Anesthesia Post-op Note  Patient: Jessica Moody  Procedure(s) Performed: Procedure(s): COLONOSCOPY WITH PROPOFOL (N/A)  Anesthesia type:General  Patient location: PACU  Post pain: Pain level controlled  Post assessment: Post-op Vital signs reviewed, Patient's Cardiovascular Status Stable, Respiratory Function Stable, Patent Airway and No signs of Nausea or vomiting  Post vital signs: Reviewed and stable  Last Vitals:  Filed Vitals:   11/30/14 1246  BP: 135/97  Pulse: 56  Temp:   Resp: 17    Level of consciousness: awake, alert  and patient cooperative  Complications: No apparent anesthesia complications

## 2014-11-30 NOTE — Anesthesia Preprocedure Evaluation (Signed)
Anesthesia Evaluation  Patient identified by MRN, date of birth, ID band Patient awake    Reviewed: Allergy & Precautions, H&P , NPO status , Patient's Chart, lab work & pertinent test results, reviewed documented beta blocker date and time   History of Anesthesia Complications (+) PONV and history of anesthetic complications  Airway Mallampati: III  TM Distance: >3 FB Neck ROM: full    Dental no notable dental hx. (+) Caps, Teeth Intact   Pulmonary neg shortness of breath, neg sleep apnea, neg COPD, neg recent URI, former smoker,    Pulmonary exam normal breath sounds clear to auscultation       Cardiovascular Exercise Tolerance: Good hypertension, On Medications (-) angina(-) CAD, (-) Past MI, (-) Cardiac Stents and (-) CABG Normal cardiovascular exam(-) dysrhythmias + Valvular Problems/Murmurs  Rhythm:regular Rate:Normal     Neuro/Psych  Headaches, neg Seizures PSYCHIATRIC DISORDERS (depression)    GI/Hepatic negative GI ROS, Neg liver ROS,   Endo/Other  neg diabetesMorbid obesity  Renal/GU negative Renal ROS  negative genitourinary   Musculoskeletal   Abdominal   Peds  Hematology negative hematology ROS (+)   Anesthesia Other Findings Past Medical History:   Hypertension                                                 Heart murmur                                                 Cancer (HCC)                                                   Comment:colon   Depression                                                   Gastritis                                                    Headache                                                     PONV (postoperative nausea and vomiting)                     Obesity                                                      Insomnia  Hyperlipemia                                                 Reproductive/Obstetrics negative  OB ROS                             Anesthesia Physical Anesthesia Plan  ASA: II  Anesthesia Plan: General   Post-op Pain Management:    Induction:   Airway Management Planned:   Additional Equipment:   Intra-op Plan:   Post-operative Plan:   Informed Consent: I have reviewed the patients History and Physical, chart, labs and discussed the procedure including the risks, benefits and alternatives for the proposed anesthesia with the patient or authorized representative who has indicated his/her understanding and acceptance.   Dental Advisory Given  Plan Discussed with: Anesthesiologist, CRNA and Surgeon  Anesthesia Plan Comments:         Anesthesia Quick Evaluation

## 2014-11-30 NOTE — Discharge Instructions (Signed)

## 2014-11-30 NOTE — Transfer of Care (Signed)
Immediate Anesthesia Transfer of Care Note  Patient: Jessica Moody  Procedure(s) Performed: Procedure(s): COLONOSCOPY WITH PROPOFOL (N/A)  Patient Location: Endoscopy Unit  Anesthesia Type:General  Level of Consciousness: awake  Airway & Oxygen Therapy: Patient Spontanous Breathing and Patient connected to nasal cannula oxygen  Post-op Assessment: Report given to RN  Post vital signs: Reviewed  Last Vitals:  Filed Vitals:   11/30/14 1216  BP: 131/59  Pulse: 69  Temp: 35.8 C  Resp: 16    Complications: No apparent anesthesia complications

## 2014-11-30 NOTE — Op Note (Signed)
[facility] Gastroenterology Patient Name: Jessica Moody Procedure Date: 11/30/2014 11:31 AM MRN: [id number] Account #: [id number] Date of Birth: Aug 01, 1965 Admit Type: Outpatient Age: 49 Room: [facility] ENDO ROOM 2 Gender: Female Note Status: Finalized Procedure:         Colonoscopy Indications:       High risk colon cancer surveillance: Personal history of                     colon cancer, Last colonoscopy: August 2015, Incidental -                     Clinically significant diarrhea of unexplained origin Patient Profile:   This is a 49 year old female. Providers:         Gerrit Heck. Rayann Heman, MD Referring MD:      Ocie Cornfield. Ouida Sills, MD (Referring MD), Clelia Croft MD                     ( [facility] Oncologist) Medicines:         Propofol per Anesthesia Complications:     No immediate complications. Procedure:         Pre-Anesthesia Assessment:                    - Prior to the procedure, a History and Physical was                     performed, and patient medications, allergies and                     sensitivities were reviewed. The patient's tolerance of                     previous anesthesia was reviewed.                    After obtaining informed consent, the colonoscope was                     passed under direct vision. Throughout the procedure, the                     patient's blood pressure, pulse, and oxygen saturations                     were monitored continuously. The Colonoscope was                     introduced through the anus and advanced to the the cecum,                     identified by appendiceal orifice and ileocecal valve. The                     colonoscopy was performed without difficulty. The patient                     tolerated the procedure well. The quality of the bowel                     preparation was fair. Findings:      The perianal and digital rectal examinations were normal.      There was evidence of a prior end-to-side  colo-colonic anastomosis in       the recto-sigmoid colon.  This was patent. This was characterized by       healthy appearing mucosa. This was traversed.      The exam was otherwise without abnormality on direct and retroflexion       views.      Biopsies for histology were taken with a cold forceps from the right       colon, left colon and rectum for evaluation of microscopic colitis.      - Unable to intubate terminal ileum due to looping Impression:        - Patent end-to-side colo-colonic anastomosis,                     characterized by healthy appearing mucosa.                    - The examination was otherwise normal on direct and                     retroflexion views.                    - No specimens collected. Recommendation:    - Observe patient in GI recovery unit.                    - Resume regular diet.                    - Continue present medications.                    - Repeat colonoscopy in 2 years for surveillance.                    - If biopsies area non-diagnostic, consider treating                     diarrhea with colestipol, probiotic, possible course of                     xifaxin.                    - Return to referring physician.                    - The findings and recommendations were discussed with the                     patient.                    - The findings and recommendations were discussed with the                     patient's family.                    - Await pathology results. Procedure Code(s): --- Professional ---                    [phone], Colonoscopy, flexible; diagnostic, including                     collection of specimen(s) by brushing or washing, when                     performed (separate procedure) CPT copyright 2014 American Medical Association. All rights reserved. The codes documented in this report are preliminary  and upon coder review may  be revised to meet current compliance requirements. Mellody Life,  MD 11/30/2014 12:14:07 PM This report has been signed electronically. Number of Addenda: 0 Note Initiated On: 11/30/2014 11:31 AM Scope Withdrawal Time: 0 hours 22 minutes 5 seconds  Total Procedure Duration: 0 hours 34 minutes 41 seconds       [facility]

## 2014-12-01 ENCOUNTER — Encounter: Payer: Self-pay | Admitting: Gastroenterology

## 2014-12-01 LAB — SURGICAL PATHOLOGY

## 2015-02-24 ENCOUNTER — Ambulatory Visit: Payer: Managed Care, Other (non HMO) | Admitting: Podiatry

## 2015-05-12 ENCOUNTER — Ambulatory Visit (INDEPENDENT_AMBULATORY_CARE_PROVIDER_SITE_OTHER): Payer: Managed Care, Other (non HMO) | Admitting: Podiatry

## 2015-05-12 ENCOUNTER — Encounter: Payer: Self-pay | Admitting: Podiatry

## 2015-05-12 VITALS — BP 125/68 | HR 62 | Resp 16

## 2015-05-12 DIAGNOSIS — L603 Nail dystrophy: Secondary | ICD-10-CM | POA: Diagnosis not present

## 2015-05-12 DIAGNOSIS — Q828 Other specified congenital malformations of skin: Secondary | ICD-10-CM

## 2015-05-12 DIAGNOSIS — C189 Malignant neoplasm of colon, unspecified: Secondary | ICD-10-CM | POA: Insufficient documentation

## 2015-05-12 DIAGNOSIS — E785 Hyperlipidemia, unspecified: Secondary | ICD-10-CM | POA: Insufficient documentation

## 2015-05-12 NOTE — Progress Notes (Signed)
   Subjective:    Patient ID: Jessica Moody, female    DOB: [birth year], 50 y.o.   MRN: [id number]  HPI: She presents today with a chief complaint of a painful hallux nail right. She states it is been thick and not attractive for many months and has tried over-the-counter therapies to no avail. She remembers injuring the toe some years back dropping a can of food out of the cabinet. She is also having pain lateral aspect of fifth metatarsal left. She states that it feels a walking on a rock and is exquisitely painful. She denies trauma for bodies. She trims the calluses down herself.    Review of Systems  All other systems reviewed and are negative.      Objective:   Physical Exam: Vital signs are stable alert and oriented 3 pulses are strongly palpable. Neurologic sensorium is intact. Deep tendon reflexes are intact bilateral and muscle strength is normal bilateral. Orthopedic evaluation of his roots all joints distal to the ankle for range of motion without crepitation rectus foot type. Porokeratotic lesion sub-fifth metatarsal head of the left foot. The thick dystrophic nail hallux right mildly tender on palpation. The distal medial aspect of the nail plate appears to be worse. There is originally nail usually indicative of trauma to the root or bed.        Assessment & Plan:  Assessment: Nail dystrophy hallux right. Poor keratosis sub-fifth metatarsal head left foot.  Plan: To sample of the skin and nail today to be sent for pathologic evaluation right hallux. Debrided the porokeratotic lesion left plantar forefoot. We will notify her once the pathology revealed has returned.

## 2015-06-09 ENCOUNTER — Ambulatory Visit (INDEPENDENT_AMBULATORY_CARE_PROVIDER_SITE_OTHER): Payer: Managed Care, Other (non HMO) | Admitting: Podiatry

## 2015-06-09 DIAGNOSIS — Z79899 Other long term (current) drug therapy: Secondary | ICD-10-CM

## 2015-06-09 DIAGNOSIS — L603 Nail dystrophy: Secondary | ICD-10-CM | POA: Diagnosis not present

## 2015-06-09 MED ORDER — TERBINAFINE HCL 250 MG PO TABS: 250.0000 mg | ORAL_TABLET | Freq: Every day | ORAL | Status: DC

## 2015-06-09 NOTE — Progress Notes (Signed)
She presents today for follow-up of her nail culture.  Objective: Vital signs are stable she is alert and oriented 3. States that she's recently been biopsied for fatty liver. We did discuss the onychomycosis confirmed by the pathology report.  Assessment: Onychomycosis.  Plan: We discussed oral therapy versus laser therapy or combination thereof at this point I went ahead and called in her Lamisil 250 mg tablets 1 by mouth daily and I also sent her over blood work to be performed. She is going to discuss this with her oncologist prior to starting the medication should she not wish for her to sharp this medicine we will consider laser therapy.

## 2015-06-10 ENCOUNTER — Telehealth: Payer: Self-pay | Admitting: *Deleted

## 2015-06-10 LAB — HEPATIC FUNCTION PANEL
ALT: 18 IU/L (ref 0–32)
AST: 22 IU/L (ref 0–40)
Albumin: 4 g/dL (ref 3.5–5.5)
Alkaline Phosphatase: 61 IU/L (ref 39–117)
Bilirubin Total: 0.4 mg/dL (ref 0.0–1.2)
Bilirubin, Direct: 0.12 mg/dL (ref 0.00–0.40)
TOTAL PROTEIN: 6.7 g/dL (ref 6.0–8.5)

## 2015-06-10 NOTE — Telephone Encounter (Addendum)
Pt state [facility] says the lam[facility]il is not there.  I reviewed medication orders and lamisil was confirmed received 06/09/2015 at 0920am.  I left message informing pt of the Lamisil status and to call pharmacy to make sure they have the rx not just that the insurance won't cover, in which case we will call to a [facility] of her choice to use their $4.00 formulary.  06/11/2015-Pt states she received a call 06/10/2015 stating the [facility] had [facility], and she [facility]'t [facility] in years, and would like the rx called to the [facility] in [place]. I changed the pharmacy to [facility] and informed pt the rx was there to be picked up.

## 2015-06-10 NOTE — Telephone Encounter (Addendum)
-----   Message from Garrel Ridgel, [place] sent at 06/10/2015  7:00 AM EDT ----- Blood work looks perfect.  Left message to begin medication as directed.

## 2015-06-11 MED ORDER — TERBINAFINE HCL 250 MG PO TABS: 250.0000 mg | ORAL_TABLET | Freq: Every day | ORAL | Status: DC

## 2015-06-14 ENCOUNTER — Encounter: Payer: Self-pay | Admitting: Podiatry

## 2015-06-15 NOTE — Telephone Encounter (Signed)
Pt states she will begin the Lamisil, around 06/23/2015 and is scheduled to have blood work at [facility] 07/13/2015.  Would that 07/13/2015 labs be sufficient, if normal to continue the Lamisil to completion?

## 2015-07-14 ENCOUNTER — Ambulatory Visit (INDEPENDENT_AMBULATORY_CARE_PROVIDER_SITE_OTHER): Payer: Managed Care, Other (non HMO) | Admitting: Podiatry

## 2015-07-14 ENCOUNTER — Encounter: Payer: Self-pay | Admitting: Podiatry

## 2015-07-14 DIAGNOSIS — Z79899 Other long term (current) drug therapy: Secondary | ICD-10-CM

## 2015-07-14 DIAGNOSIS — L603 Nail dystrophy: Secondary | ICD-10-CM | POA: Diagnosis not present

## 2015-07-14 MED ORDER — TERBINAFINE HCL 250 MG PO TABS: 250.0000 mg | ORAL_TABLET | Freq: Every day | ORAL | Status: DC

## 2015-07-14 NOTE — Progress Notes (Signed)
She presents today for follow-up of her Lamisil therapy after the first month. She brings with her liver profile performed at [facility]. Her doctor states that her liver enzymes looked very good. She denies fever chills nausea vomiting muscle aches pains itching and rashes.  Objective: No change in the onychomycosis as of yet.  Assessment: Onychomycosis.  Plan: Treatment with Lamisil therapy. Her doctor would like her to have a liver profile every month while taking the medication. She is going to check with her doctor to see if she can write a standing order for her. Otherwise we will be happy to have her in the office in another requisition written monthly. I did send her medication over today Lamisil tablets 250 mg #90 one by mouth daily with no refills follow up with her in 4 months and will evaluate her labs monthly.

## 2015-08-09 ENCOUNTER — Ambulatory Visit (INDEPENDENT_AMBULATORY_CARE_PROVIDER_SITE_OTHER): Payer: Managed Care, Other (non HMO) | Admitting: Podiatry

## 2015-08-09 ENCOUNTER — Encounter: Payer: Self-pay | Admitting: Podiatry

## 2015-08-09 DIAGNOSIS — L603 Nail dystrophy: Secondary | ICD-10-CM

## 2015-08-09 DIAGNOSIS — Z79899 Other long term (current) drug therapy: Secondary | ICD-10-CM

## 2015-08-09 NOTE — Progress Notes (Signed)
She presents today on advice from her primary care provider to have her liver profile performed each month after taking her Lamisil therapy. She has now completed 60 days worth of therapy. She denies any complications taking the medications.  Objective: Vital signs are stable she is alert and oriented 3. No changes in the place she had.  Assessment: Onychomycosis long-term therapy with Lamisil.  Plan: Liver profiles performed today and she will continue her medication. Follow up with her in 1 month. Another liver profile performed at that time.

## 2015-08-10 LAB — HEPATIC FUNCTION PANEL
ALBUMIN: 3.8 g/dL (ref 3.5–5.5)
ALT: 15 IU/L (ref 0–32)
AST: 14 IU/L (ref 0–40)
Alkaline Phosphatase: 54 IU/L (ref 39–117)
BILIRUBIN TOTAL: 0.3 mg/dL (ref 0.0–1.2)
Bilirubin, Direct: 0.08 mg/dL (ref 0.00–0.40)
TOTAL PROTEIN: 6.5 g/dL (ref 6.0–8.5)

## 2015-08-11 ENCOUNTER — Telehealth: Payer: Self-pay | Admitting: *Deleted

## 2015-08-11 NOTE — Telephone Encounter (Addendum)
-----   Message from Garrel Ridgel, [place] sent at 08/10/2015  7:09 AM EDT ----- Blood work looks perfect and may continue medication. 08/11/2015-left message instruction pt to continue the medication, lab were good.

## 2015-08-27 ENCOUNTER — Encounter: Payer: Self-pay | Admitting: *Deleted

## 2015-09-13 ENCOUNTER — Ambulatory Visit (INDEPENDENT_AMBULATORY_CARE_PROVIDER_SITE_OTHER): Payer: Managed Care, Other (non HMO) | Admitting: Podiatry

## 2015-09-13 ENCOUNTER — Encounter: Payer: Self-pay | Admitting: Podiatry

## 2015-09-13 DIAGNOSIS — Z79899 Other long term (current) drug therapy: Secondary | ICD-10-CM | POA: Diagnosis not present

## 2015-09-13 DIAGNOSIS — L603 Nail dystrophy: Secondary | ICD-10-CM

## 2015-09-13 MED ORDER — TERBINAFINE HCL 250 MG PO TABS: 250.0000 mg | ORAL_TABLET | Freq: Every day | ORAL | 0 refills | Status: DC

## 2015-09-13 NOTE — Progress Notes (Signed)
She presents today for her monthly follow-up regarding Lamisil therapy. She states that she is doing very well but the toe has not grown out much at all. She refers to the hallux right. She is finished 90 days of medication and we are checking her liver monthly. She denies fever chills nausea vomiting muscle aches pains rashes or itching.  Objective: Vital signs are stable she is alert and oriented 3. Pulses are palpable. Hallux nail right does demonstrate onychomycosis unchanged.  Assessment: Pain in limb secondary to onychomycosis long-term therapy with Lamisil.  Plan: Requesting another liver profile and another 30 days of Lamisil. I will follow up with her in 2 months and consider either changing the medication if there is minimal resolution or discussing laser therapy.

## 2015-09-14 ENCOUNTER — Other Ambulatory Visit: Payer: Self-pay | Admitting: Obstetrics and Gynecology

## 2015-09-14 DIAGNOSIS — Z1231 Encounter for screening mammogram for malignant neoplasm of breast: Secondary | ICD-10-CM

## 2015-09-14 LAB — HEPATIC FUNCTION PANEL
ALK PHOS: 54 IU/L (ref 39–117)
ALT: 17 IU/L (ref 0–32)
AST: 17 IU/L (ref 0–40)
Albumin: 3.7 g/dL (ref 3.5–5.5)
Bilirubin Total: 0.3 mg/dL (ref 0.0–1.2)
Bilirubin, Direct: 0.09 mg/dL (ref 0.00–0.40)
Total Protein: 6.1 g/dL (ref 6.0–8.5)

## 2015-09-16 ENCOUNTER — Telehealth: Payer: Self-pay | Admitting: *Deleted

## 2015-09-16 NOTE — Telephone Encounter (Addendum)
-----   Message from Garrel Ridgel, [place] sent at 09/14/2015  7:47 AM EDT ----- Blood work looks great and may continue mediation. 09/16/2015-Informed pt of Dr. Stephenie Acres instructions.

## 2015-09-30 ENCOUNTER — Ambulatory Visit
Admission: RE | Admit: 2015-09-30 | Discharge: 2015-09-30 | Disposition: A | Discharge status: Home / Self Care | Payer: Managed Care, Other (non HMO) | Source: Ambulatory Visit | Attending: Obstetrics and Gynecology | Admitting: Obstetrics and Gynecology

## 2015-09-30 DIAGNOSIS — Z1231 Encounter for screening mammogram for malignant neoplasm of breast: Secondary | ICD-10-CM | POA: Diagnosis present

## 2015-09-30 HISTORY — DX: Malignant neoplasm of colon, unspecified: C18.9

## 2015-11-17 ENCOUNTER — Ambulatory Visit (INDEPENDENT_AMBULATORY_CARE_PROVIDER_SITE_OTHER): Payer: Managed Care, Other (non HMO) | Admitting: Podiatry

## 2015-11-17 ENCOUNTER — Encounter: Payer: Self-pay | Admitting: Podiatry

## 2015-11-17 DIAGNOSIS — B359 Dermatophytosis, unspecified: Secondary | ICD-10-CM

## 2015-11-17 DIAGNOSIS — L603 Nail dystrophy: Secondary | ICD-10-CM

## 2015-11-17 MED ORDER — DESOXIMETASONE 0.25 % EX CREA: 1.0000 "application " | TOPICAL_CREAM | Freq: Two times a day (BID) | CUTANEOUS | 1 refills | Status: DC

## 2015-11-17 NOTE — Progress Notes (Signed)
She presents today after 120 days of Lamisil therapy. She states that really just doesn't seem like it is doing anything and I would like to consider laser therapy.  Objective: Vital signs are stable alert and oriented 3. Pulses are palpable. No major change in her hallux nail plate right. Though there is small change to the lesser digital nail plates.  Assessment: Onychomycosis recalcitrant to oral therapy.  Plan: We will consider laser therapy for the toenails. She will follow up with the [facility] office to set that up. I provided her with the phone number today.

## 2016-02-09 ENCOUNTER — Ambulatory Visit: Payer: Managed Care, Other (non HMO) | Admitting: Podiatry

## 2016-02-21 ENCOUNTER — Ambulatory Visit (INDEPENDENT_AMBULATORY_CARE_PROVIDER_SITE_OTHER): Payer: Managed Care, Other (non HMO) | Admitting: Podiatry

## 2016-02-21 DIAGNOSIS — B359 Dermatophytosis, unspecified: Secondary | ICD-10-CM | POA: Diagnosis not present

## 2016-02-21 MED ORDER — NAFTIFINE HCL 2 % EX CREA: 1.0000 "application " | TOPICAL_CREAM | Freq: Two times a day (BID) | CUTANEOUS | 3 refills | Status: DC

## 2016-02-21 NOTE — Progress Notes (Signed)
She presents today for follow-up of erythematous macules to the left forefoot. She states they seem to be getting worse with the use of the Topicort. She states they still inch and extremely dry.  Objective: Vital signs are stable she is alert and oriented 3. These do appear to be worsening and today do demonstrate to be yeast or tinea.  Assessment: Tinea.  Plan: Certain her on Naftin 2% cream to be applied twice daily follow-up with me as needed otherwise she is to follow-up with dermatology in February.

## 2016-05-18 ENCOUNTER — Ambulatory Visit: Payer: Managed Care, Other (non HMO)

## 2016-06-08 ENCOUNTER — Ambulatory Visit: Payer: Managed Care, Other (non HMO)

## 2016-09-04 ENCOUNTER — Other Ambulatory Visit: Payer: Self-pay | Admitting: Obstetrics and Gynecology

## 2016-09-04 ENCOUNTER — Ambulatory Visit: Payer: Managed Care, Other (non HMO)

## 2016-09-04 DIAGNOSIS — Z1231 Encounter for screening mammogram for malignant neoplasm of breast: Secondary | ICD-10-CM

## 2016-09-12 ENCOUNTER — Encounter: Payer: Self-pay | Admitting: Cardiology

## 2016-09-12 ENCOUNTER — Ambulatory Visit (INDEPENDENT_AMBULATORY_CARE_PROVIDER_SITE_OTHER): Payer: PRIVATE HEALTH INSURANCE | Admitting: Cardiology

## 2016-09-12 VITALS — BP 112/70 | HR 56 | Ht 63.0 in | Wt 168.0 lb

## 2016-09-12 DIAGNOSIS — I480 Paroxysmal atrial fibrillation: Secondary | ICD-10-CM | POA: Diagnosis not present

## 2016-09-12 NOTE — Progress Notes (Signed)
Electrophysiology Office Note   Date:  09/12/2016   ID:  Mckala, Pantaleon 13-Apr-1965, MRN [id number]  PCP:  Kirk Ruths, MD  Cardiologist:   Primary Electrophysiologist:  Will Meredith Leeds, MD    Chief Complaint  Patient presents with  . Advice Only    Afib     History of Present Illness: Jessica Moody is a 51 y.o. female who is being seen today for the evaluation of atrial fibrillaiton at the request of Kirk Ruths, MD. Presenting today for electrophysiology evaluation. She has a history of colon cancer, hyperlipidemia, and hypertension. She presents today for evaluation of new onset atrial fibrillation. 8 days ago, she woke up from sleep, went to the restroom, and noted palpitations. She also had fatigue and shortness of breath. She tried to hold her breath which did not help her palpitations. She woke up the next morning and continued to have symptoms. She went to her primary physician's office and was diagnosed with atrial fibrillation. She was put on 50 mg of metoprolol twice a day as well as a full dose aspirin. She converted to sinus rhythm 2 days after her episode of atrial fibrillation.  Today, she denies symptoms of palpitations, chest pain, shortness of breath, orthopnea, PND, lower extremity edema, claudication, dizziness, presyncope, syncope, bleeding, or neurologic sequela. The patient is tolerating medications without difficulties.    Past Medical History:  Diagnosis Date  . Cancer ([place])    colon  . Colon cancer ([place])   . Depression   . Gastritis   . Headache   . Heart murmur   . Hyperlipemia   . Hypertension   . Insomnia   . Obesity   . PONV (postoperative nausea and vomiting)    Past Surgical History:  Procedure Laterality Date  . APPENDECTOMY    . CESAREAN SECTION N/A    C-Section X 2  . COLON SURGERY    . COLONOSCOPY WITH PROPOFOL N/A 11/30/2014   Procedure: COLONOSCOPY WITH PROPOFOL;  Surgeon: Josefine Class, MD;  Location: [facility]  ENDOSCOPY;  Service: Endoscopy;  Laterality: N/A;  . LAPAROSCOPIC GASTRIC RESTRICTIVE DUODENAL PROCEDURE (DUODENAL SWITCH) N/A 06/30/2014   Procedure: LAPAROSCOPIC DUODENAL SWITCH WTIH BILIARY PANCREATIC DIVERSION ;  Surgeon: [name], MD;  Location: [facility] ORS;  Service: General;  Laterality: N/A;  . TONSILLECTOMY       Current Outpatient Prescriptions  Medication Sig Dispense Refill  . Ascorbic Acid (VITAMIN C) 1000 MG tablet Take 1,000 mg by mouth daily.    . Calcium-Magnesium-Vitamin D (CALCIUM 1200+D3 PO) Take 1 capsule by mouth daily.    Marland Kitchen estradiol (ESTRACE) 0.1 MG/GM vaginal cream Use as directed    . ferrous fumarate (HEMOCYTE - 106 MG FE) 325 (106 FE) MG TABS tablet Take 1 tablet by mouth daily.    . lipase/protease/amylase (CREON) 12000 UNITS CPEP capsule Take 24,000 Units by mouth 3 (three) times daily before meals.    . metoprolol succinate (TOPROL-XL) 50 MG 24 hr tablet Take 50 mg by mouth daily.    . Multiple Vitamin (MULTIVITAMIN) tablet Take 1 tablet by mouth daily.     No current facility-administered medications for this visit.     Allergies:   Oxycodone; Poison ivy extract; Nsaids; Prednisone; and Voltaren [diclofenac sodium]   Social History:  The patient  reports that she quit smoking about 18 years ago. Her smoking use included Cigarettes. She smoked 1.00 pack per day. She has never used smokeless tobacco. She reports  that she does not drink alcohol or use drugs.   Family History:  The patient's family history includes Diabetes type II in her mother; Heart failure in her mother; Hypertension in her mother; Kidney disease in her mother.    ROS:  Please see the history of present illness.   Otherwise, review of systems is positive for Chest pain, leg swelling, shortness of breath, palpitations, diarrhea, anxiety, easy bruising.   All other systems are reviewed and negative.    PHYSICAL EXAM: VS:  BP 112/70   Pulse (!) 56   Ht 5\' 3"  (1.6 m)   Wt 168 lb (76.2  kg)   SpO2 98%   BMI 29.76 kg/m  , BMI Body mass index is 29.76 kg/m. GEN: Well nourished, well developed, in no acute distress  HEENT: normal  Neck: no JVD, carotid bruits, or masses Cardiac: RRR; no murmurs, rubs, or gallops,no edema  Respiratory:  clear to auscultation bilaterally, normal work of breathing GI: soft, nontender, nondistended, + BS MS: no deformity or atrophy  Skin: warm and dry Neuro:  Strength and sensation are intact Psych: euthymic mood, full affect  EKG:  EKG is ordered today. Personal review of the ekg ordered shows sinus rhythm, rate 56  Recent Labs: No results found for requested labs within last 8760 hours.    Lipid Panel  No results found for: CHOL, TRIG, HDL, CHOLHDL, VLDL, LDLCALC, LDLDIRECT   Wt Readings from Last 3 Encounters:  09/12/16 168 lb (76.2 kg)  11/30/14 216 lb (98 kg)  06/30/14 (!) 309 lb 6.4 oz (140.3 kg)      Other studies Reviewed: Additional studies/ records that were reviewed today include: Epic notes     ASSESSMENT AND PLAN:  1.   Paroxysmal atrial fibrillation: EKG from her primary physician's office shows atrial fibrillation. She is currently in sinus rhythm. She is on Toprol-XL 50 mg. Tolerating the medication well. Fortunately her stroke risk is low and that she does not require anticoagulation. She did have hypertension previously, but has not had any further hypertension after gastric bypass. We'll get an echocardiogram today, and see her back in 3 months for further evaluation.  This patients CHA2DS2-VASc Score and unadjusted Ischemic Stroke Rate (% per year) is equal to 0.6 % stroke rate/year from a score of 1  Above score calculated as 1 point each if present [CHF, HTN, DM, Vascular=MI/PAD/Aortic Plaque, Age if 65-74, or Female] Above score calculated as 2 points each if present [Age > 75, or Stroke/TIA/TE]  2. Hypertension: Previously had hypertension but has since resolved after gastric bypass.  Current  medicines are reviewed at length with the patient today.   The patient does not have concerns regarding her medicines.  The following changes were made today:  none  Labs/ tests ordered today include:  Orders Placed This Encounter  Procedures  . EKG 12-Lead  . ECHOCARDIOGRAM COMPLETE   Disposition:   FU with Will Camnitz 3 months  Signed, Will Meredith Leeds, MD  09/12/2016 4:41 PM     [address] [place] [address] [phone] (office) [phone] (fax)

## 2016-09-12 NOTE — Patient Instructions (Addendum)
Medication Instructions:  Your physician has recommended you make the following change in your medication:  1. STOP Aspirin  If you need a refill on your cardiac medications before your next appointment, please call your pharmacy.   Labwork: None ordered  Testing/Procedures: Your physician has requested that you have an echocardiogram. Echocardiography is a painless test that uses sound waves to create images of your heart. It provides your doctor with information about the size and shape of your heart and how well your heart's chambers and valves are working. This procedure takes approximately one hour. There are no restrictions for this procedure.  Follow-Up: Your physician recommends that you schedule a follow-up appointment in: 3 months with Dr. Curt Bears.   Thank you for choosing [facility]!!   Trinidad Curet, RN [phone]  Any Other Special Instructions Will Be Listed Below (If Applicable).    Atrial Fibrillation Atrial fibrillation is a type of heartbeat that is irregular or fast (rapid). If you have this condition, your heart keeps quivering in a weird (chaotic) way. This condition can make it so your heart cannot pump blood normally. Having this condition gives a person more risk for stroke, heart failure, and other heart problems. There are different types of atrial fibrillation. Talk with your doctor to learn about the type that you have. Follow these instructions at home:  Take over-the-counter and prescription medicines only as told by your doctor.  If your doctor prescribed a blood-thinning medicine, take it exactly as told. Taking too much of it can cause bleeding. If you do not take enough of it, you will not have the protection that you need against stroke and other problems.  Do not use any tobacco products. These include cigarettes, chewing tobacco, and e-cigarettes. If you need help quitting, ask your doctor.  If you have apnea (obstructive sleep apnea),  manage it as told by your doctor.  Do not drink alcohol.  Do not drink beverages that have caffeine. These include coffee, soda, and tea.  Maintain a healthy weight. Do not use diet pills unless your doctor says they are safe for you. Diet pills may make heart problems worse.  Follow diet instructions as told by your doctor.  Exercise regularly as told by your doctor.  Keep all follow-up visits as told by your doctor. This is important. Contact a doctor if:  You notice a change in the speed, rhythm, or strength of your heartbeat.  You are taking a blood-thinning medicine and you notice more bruising.  You get tired more easily when you move or exercise. Get help right away if:  You have pain in your chest or your belly (abdomen).  You have sweating or weakness.  You feel sick to your stomach (nauseous).  You notice blood in your throw up (vomit), poop (stool), or pee (urine).  You are short of breath.  You suddenly have swollen feet and ankles.  You feel dizzy.  Your suddenly get weak or numb in your face, arms, or legs, especially if it happens on one side of your body.  You have trouble talking, trouble understanding, or both.  Your face or your eyelid droops on one side. These symptoms may be an emergency. Do not wait to see if the symptoms will go away. Get medical help right away. Call your local emergency services (911 in the [place] Do not drive yourself to the hospital. This information is not intended to replace advice given to you by your health care provider. Make  sure you discuss any questions you have with your health care provider. Document Released: 10/19/2007 Document Revised: 06/17/2015 Document Reviewed: 05/06/2014 Elsevier Interactive Patient Education  [employer].

## 2016-09-13 ENCOUNTER — Encounter: Payer: Self-pay | Admitting: Cardiology

## 2016-09-15 ENCOUNTER — Ambulatory Visit: Payer: PRIVATE HEALTH INSURANCE

## 2016-09-20 ENCOUNTER — Other Ambulatory Visit: Payer: Self-pay

## 2016-09-20 ENCOUNTER — Ambulatory Visit (HOSPITAL_COMMUNITY): Payer: PRIVATE HEALTH INSURANCE | Attending: Cardiology

## 2016-09-20 DIAGNOSIS — I480 Paroxysmal atrial fibrillation: Secondary | ICD-10-CM | POA: Diagnosis present

## 2016-09-20 DIAGNOSIS — Z6829 Body mass index (BMI) 29.0-29.9, adult: Secondary | ICD-10-CM | POA: Insufficient documentation

## 2016-09-20 DIAGNOSIS — I119 Hypertensive heart disease without heart failure: Secondary | ICD-10-CM | POA: Diagnosis not present

## 2016-09-20 DIAGNOSIS — I351 Nonrheumatic aortic (valve) insufficiency: Secondary | ICD-10-CM | POA: Insufficient documentation

## 2016-09-20 DIAGNOSIS — E785 Hyperlipidemia, unspecified: Secondary | ICD-10-CM | POA: Insufficient documentation

## 2016-09-20 DIAGNOSIS — E669 Obesity, unspecified: Secondary | ICD-10-CM | POA: Diagnosis not present

## 2016-09-22 ENCOUNTER — Telehealth: Payer: Self-pay | Admitting: *Deleted

## 2016-09-22 ENCOUNTER — Encounter: Payer: Self-pay | Admitting: Cardiology

## 2016-09-22 DIAGNOSIS — R931 Abnormal findings on diagnostic imaging of heart and coronary circulation: Secondary | ICD-10-CM

## 2016-09-22 NOTE — Telephone Encounter (Signed)
Echo results reviewed with patient -- informed she has mild-mod decreased heart muscle function.  We discussed this at length. Advised that Dr. Curt Bears would like her to have a lexi for further evaluation.  She is agreeable and understands office will call her to arrange this and follow up with Dr. Curt Bears. She reports previous echo & stress testing several years ago and is going to try and get those results faxed to our office so they we may compare and evaluate for changes in testing. Phone call totaled 30 min. She will call back if there are any issues.

## 2016-09-26 ENCOUNTER — Encounter: Payer: Self-pay | Admitting: Podiatry

## 2016-09-26 ENCOUNTER — Ambulatory Visit (INDEPENDENT_AMBULATORY_CARE_PROVIDER_SITE_OTHER): Payer: PRIVATE HEALTH INSURANCE | Admitting: Podiatry

## 2016-09-26 ENCOUNTER — Ambulatory Visit (INDEPENDENT_AMBULATORY_CARE_PROVIDER_SITE_OTHER): Payer: PRIVATE HEALTH INSURANCE

## 2016-09-26 DIAGNOSIS — S99921A Unspecified injury of right foot, initial encounter: Secondary | ICD-10-CM | POA: Diagnosis not present

## 2016-09-26 DIAGNOSIS — S92504A Nondisplaced unspecified fracture of right lesser toe(s), initial encounter for closed fracture: Secondary | ICD-10-CM | POA: Diagnosis not present

## 2016-09-27 ENCOUNTER — Telehealth: Payer: Self-pay | Admitting: Cardiology

## 2016-09-27 NOTE — Telephone Encounter (Signed)
New message    Pt is calling for Jessica Moody. She said she is calling to talk about records she is to have sent over. Please call.

## 2016-09-27 NOTE — Telephone Encounter (Signed)
Pt just wanted [place] to know that she is having records faxed over.  Informed pt that we received almost 200 pages yesterday but that I have not had a chance to review them.

## 2016-09-28 ENCOUNTER — Telehealth: Payer: Self-pay | Admitting: Cardiology

## 2016-09-28 ENCOUNTER — Ambulatory Visit: Payer: PRIVATE HEALTH INSURANCE

## 2016-09-28 NOTE — Telephone Encounter (Signed)
Rollene Fare, nuclear med scheduler, called patient and arranged testing for next week.

## 2016-09-28 NOTE — Progress Notes (Signed)
   HPI: Patient is a 51 year old female presenting today with a new complaint of pain to the right fourth toe that began about two weeks ago secondary to tripping over a curb. She states she was at a [employer] game on 09[place]08[place]16 when the injury occurred. She denies modifying factors. She has been buddy taping the toe. She is here for further evaluation and treatment.   Past Medical History:  Diagnosis Date  . Cancer ([place])    colon  . Colon cancer ([place])   . Depression   . Gastritis   . Headache   . Heart murmur   . Hyperlipemia   . Hypertension   . Insomnia   . Obesity   . PONV (postoperative nausea and vomiting)      Physical Exam: General: The patient is alert and oriented x3 in no acute distress.  Dermatology: Skin is warm, dry and supple bilateral lower extremities. Negative for open lesions or macerations.  Vascular: Palpable pedal pulses bilaterally. No edema or erythema noted. Capillary refill within normal limits.  Neurological: Epicritic and protective threshold grossly intact bilaterally.   Musculoskeletal Exam: Pain on palpation of the right fourth toe. Range of motion within normal limits to all pedal and ankle joints bilateral. Muscle strength 5/5 in all groups bilateral.   Radiographic Exam:  Closed, nondisplaced fracture of the proximal phalanx of the right fourth toe.   Assessment: - Fracture of the proximal phalanx of the right fourth toe.   Plan of Care:  - Patient evaluation. X-Rays reviewed.  - Continue buddy taping. - RecommendedTylenol as needed.  - Recommended good shoe gear. - Return to clinic when necessary.    Edrick Kins, DPM [facility]  Dr. Edrick Kins, DPM    [address], [place] [address]                Office [phone]  Fax [phone]

## 2016-09-28 NOTE — Telephone Encounter (Signed)
New message      Calling to let the nurse know she still has not be called to schedule test

## 2016-10-03 ENCOUNTER — Ambulatory Visit (HOSPITAL_COMMUNITY): Payer: PRIVATE HEALTH INSURANCE | Attending: Cardiology

## 2016-10-03 DIAGNOSIS — R9439 Abnormal result of other cardiovascular function study: Secondary | ICD-10-CM | POA: Diagnosis not present

## 2016-10-03 DIAGNOSIS — R931 Abnormal findings on diagnostic imaging of heart and coronary circulation: Secondary | ICD-10-CM | POA: Insufficient documentation

## 2016-10-03 LAB — MYOCARDIAL PERFUSION IMAGING
CHL CUP RESTING HR STRESS: 53 {beats}/min
LV sys vol: 87 mL
LVDIAVOL: 166 mL (ref 46–106)
NUC STRESS TID: 0.97
Peak HR: 90 {beats}/min
RATE: 0.34
SDS: 2
SRS: 1
SSS: 3

## 2016-10-03 MED ORDER — TECHNETIUM TC 99M TETROFOSMIN IV KIT
32.0000 | PACK | Freq: Once | INTRAVENOUS | Status: AC | PRN
  Administered 2016-10-03: 32 via INTRAVENOUS
  Filled 2016-10-03: qty 32

## 2016-10-03 MED ORDER — REGADENOSON 0.4 MG/5ML IV SOLN
0.4000 mg | Freq: Once | INTRAVENOUS | Status: AC
  Administered 2016-10-03: 0.4 mg via INTRAVENOUS

## 2016-10-03 MED ORDER — TECHNETIUM TC 99M TETROFOSMIN IV KIT
9.6000 | PACK | Freq: Once | INTRAVENOUS | Status: AC | PRN
  Administered 2016-10-03: 9.6 via INTRAVENOUS
  Filled 2016-10-03: qty 10

## 2016-10-03 NOTE — Progress Notes (Signed)
Electrophysiology Office Note   Date:  10/04/2016   ID:  Jessica Moody, Jessica Moody 08/13/1965, MRN [id number]  PCP:  Kirk Ruths, MD  Cardiologist:   Primary Electrophysiologist:  Diego Delancey Meredith Leeds, MD    Chief Complaint  Patient presents with  . Follow-up    PAF/discuss Echo & Lexi results     History of Present Illness: Jessica Moody is a 51 y.o. female who is being seen today for the evaluation of atrial fibrillaiton at the request of Kirk Ruths, MD. She has a history of colon cancer, hyperlipidemia, and hypertension. Presents today as follow up of AF and new onset mildly decreased LVEF.  She woke from sleep with palpitations and was found to be in AF. Had fatigue and SOB. Went to PCP and was found to be in AF. Converted 2 days later. TTE showed EF 40-45%. Myoview was low risk.  Today, denies symptoms of palpitations, chest pain, shortness of breath, orthopnea, PND, lower extremity edema, claudication, dizziness, presyncope, syncope, bleeding, or neurologic sequela. The patient is tolerating medications without difficulties. He is not had further episodes of palpitations. She does not feel that she has had any more atrial fibrillation. She does have chest discomfort that occurs when she is at rest. She has no chest discomfort with exertion.   Past Medical History:  Diagnosis Date  . Cancer ([place])    colon  . Colon cancer ([place])   . Depression   . Gastritis   . Headache   . Heart murmur   . Hyperlipemia   . Hypertension   . Insomnia   . Obesity   . PONV (postoperative nausea and vomiting)    Past Surgical History:  Procedure Laterality Date  . APPENDECTOMY    . CESAREAN SECTION N/A    C-Section X 2  . COLON SURGERY    . COLONOSCOPY WITH PROPOFOL N/A 11/30/2014   Procedure: COLONOSCOPY WITH PROPOFOL;  Surgeon: Josefine Class, MD;  Location: [facility] ENDOSCOPY;  Service: Endoscopy;  Laterality: N/A;  . LAPAROSCOPIC GASTRIC RESTRICTIVE DUODENAL PROCEDURE  (DUODENAL SWITCH) N/A 06/30/2014   Procedure: LAPAROSCOPIC DUODENAL SWITCH WTIH BILIARY PANCREATIC DIVERSION ;  Surgeon: [name], MD;  Location: [facility] ORS;  Service: General;  Laterality: N/A;  . TONSILLECTOMY       Current Outpatient Prescriptions  Medication Sig Dispense Refill  . Ascorbic Acid (VITAMIN C) 1000 MG tablet Take 1,000 mg by mouth daily.    . Calcium-Magnesium-Vitamin D (CALCIUM 1200+D3 PO) Take 1 capsule by mouth daily.    Marland Kitchen estradiol (ESTRACE) 0.1 MG/GM vaginal cream Use as directed    . lipase/protease/amylase (CREON) 12000 UNITS CPEP capsule Take 24,000 Units by mouth 3 (three) times daily before meals.    . metoprolol succinate (TOPROL-XL) 50 MG 24 hr tablet Take 50 mg by mouth daily.    . Multiple Vitamin (MULTIVITAMIN) tablet Take 1 tablet by mouth daily.    Marland Kitchen losartan (COZAAR) 25 MG tablet Take 1 tablet (25 mg total) by mouth daily. 90 tablet 3   No current facility-administered medications for this visit.     Allergies:   Oxycodone; Poison ivy extract; Nsaids; Prednisone; and Voltaren [diclofenac sodium]   Social History:  The patient  reports that she quit smoking about 18 years ago. Her smoking use included Cigarettes. She smoked 1.00 pack per day. She has never used smokeless tobacco. She reports that she does not drink alcohol or use drugs.   Family History:  The patient's  family history includes Diabetes type II in her mother; Heart failure in her mother; Hypertension in her mother; Kidney disease in her mother.    ROS:  Please see the history of present illness.   Otherwise, review of systems is positive for Appetite change, chest pressure, anxiety, headaches, balance problems.   All other systems are reviewed and negative.   PHYSICAL EXAM: VS:  BP 128/72   Pulse (!) 59   Ht 5' 3.5" (1.613 m)   Wt 165 lb 12.8 oz (75.2 kg)   BMI 28.91 kg/m  , BMI Body mass index is 28.91 kg/m. GEN: Well nourished, well developed, in no acute distress  HEENT:  normal  Neck: no JVD, carotid bruits, or masses Cardiac: RRR; no murmurs, rubs, or gallops,no edema  Respiratory:  clear to auscultation bilaterally, normal work of breathing GI: soft, nontender, nondistended, + BS MS: no deformity or atrophy  Skin: warm and dry Neuro:  Strength and sensation are intact Psych: euthymic mood, full affect  EKG:  EKG is not ordered today. Personal review of the ekg ordered 09/12/16 shows sinus rhythm, rate 56   Recent Labs: No results found for requested labs within last 8760 hours.    Lipid Panel  No results found for: CHOL, TRIG, HDL, CHOLHDL, VLDL, LDLCALC, LDLDIRECT   Wt Readings from Last 3 Encounters:  10/04/16 165 lb 12.8 oz (75.2 kg)  10/03/16 168 lb (76.2 kg)  09/12/16 168 lb (76.2 kg)      Other studies Reviewed: Additional studies/ records that were reviewed today include: TTE 09/20/16 - Left ventricle: The cavity size was mildly dilated. Wall   thickness was normal. Systolic function was mildly to moderately   reduced. The estimated ejection fraction was in the range of 40%   to 45%. Diffuse hypokinesis. - Aortic valve: There was mild regurgitation. - Right ventricle: The cavity size was mildly dilated. Wall   thickness was normal.  Myoview 10/03/16  The left ventricular ejection fraction is mildly decreased (45-54%).  Nuclear stress EF: 47%.  There was no ST segment deviation noted during stress.  Defect 1: There is a small defect of moderate severity present in the apex location.  This is a low risk study.   Low risk stress nuclear study with very mild apical ischemia; EF 47 with global hypokinesis and mild LVE.   ASSESSMENT AND PLAN:  1.   Paroxysmal atrial fibrillation: Appears to be sinus rhythm today based on symptoms and exam. She is recently been diagnosed with mild to moderate systolic heart failure and thus would require anticoagulation. We'll plan to start the relative today. On rate control with Toprol-XL 50  mg.  This patients CHA2DS2-VASc Score and unadjusted Ischemic Stroke Rate (% per year) is equal to 2.2 % stroke rate/year from a score of 2  Above score calculated as 1 point each if present [CHF, HTN, DM, Vascular=MI/PAD/Aortic Plaque, Age if 65-74, or Female] Above score calculated as 2 points each if present [Age > 75, or Stroke/TIA/TE]  2. Hypertension: Has since resolved since gastric bypass  3. Systolic heart failure, chronicity unknown: Recent echo shows EF of 40-45%. Myoview was low risk with mild apical ischemia. She is on Toprol-XL 50 mg. We'll add losartan 25 mg. She does not have any signs or symptoms of volume overload. No diuretics at this time.  Current medicines are reviewed at length with the patient today.   The patient does not have concerns regarding her medicines.  The following changes were  made today:  Start Xarelto, losartan  Labs/ tests ordered today include:  Orders Placed This Encounter  Procedures  . Basic Metabolic Panel (BMET)   Disposition:   FU with Reah Justo 3 months  Signed, Damari Suastegui Meredith Leeds, MD  10/04/2016 9:42 AM     [facility] [address] [place] [address] [phone] (office) [phone] (fax)

## 2016-10-04 ENCOUNTER — Encounter: Payer: Self-pay | Admitting: Cardiology

## 2016-10-04 ENCOUNTER — Ambulatory Visit (INDEPENDENT_AMBULATORY_CARE_PROVIDER_SITE_OTHER): Payer: PRIVATE HEALTH INSURANCE | Admitting: Cardiology

## 2016-10-04 ENCOUNTER — Ambulatory Visit: Payer: Managed Care, Other (non HMO)

## 2016-10-04 VITALS — BP 128/72 | HR 59 | Ht 63.5 in | Wt 165.8 lb

## 2016-10-04 DIAGNOSIS — I428 Other cardiomyopathies: Secondary | ICD-10-CM | POA: Diagnosis not present

## 2016-10-04 DIAGNOSIS — I480 Paroxysmal atrial fibrillation: Secondary | ICD-10-CM

## 2016-10-04 DIAGNOSIS — Z79899 Other long term (current) drug therapy: Secondary | ICD-10-CM | POA: Diagnosis not present

## 2016-10-04 DIAGNOSIS — R931 Abnormal findings on diagnostic imaging of heart and coronary circulation: Secondary | ICD-10-CM

## 2016-10-04 MED ORDER — LOSARTAN POTASSIUM 25 MG PO TABS
25.0000 mg | ORAL_TABLET | Freq: Every day | ORAL | 3 refills | Status: DC
Start: 2016-10-04 — End: 2017-07-13

## 2016-10-04 NOTE — Patient Instructions (Addendum)
Medication Instructions:  Your physician has recommended you make the following change in your medication: 1. START Losartan 25 mg daily 2. START Xarelto -- nurse will call you to start this medication, after your lab work in a week.  If you need a refill on your cardiac medications before your next appointment, please call your pharmacy.   Labwork: Your physician recommends that you return for lab work in: 1 week in [place].  You may go anytime to the [place] at [facility] to have lab work.  (when you enter the medical mall please register at the desk on your right)  Testing/Procedures: None ordered  Follow-Up: Your physician recommends that you schedule a follow-up appointment in: 3 months with Dr. Curt Bears.  Thank you for choosing [facility]!!   Trinidad Curet, RN [phone]  Any Other Special Instructions Will Be Listed Below (If Applicable).

## 2016-10-06 ENCOUNTER — Encounter (HOSPITAL_COMMUNITY): Payer: PRIVATE HEALTH INSURANCE

## 2016-10-10 ENCOUNTER — Ambulatory Visit (INDEPENDENT_AMBULATORY_CARE_PROVIDER_SITE_OTHER): Payer: PRIVATE HEALTH INSURANCE | Admitting: Podiatry

## 2016-10-10 DIAGNOSIS — L603 Nail dystrophy: Secondary | ICD-10-CM

## 2016-10-10 DIAGNOSIS — B351 Tinea unguium: Secondary | ICD-10-CM

## 2016-10-11 ENCOUNTER — Telehealth: Payer: Self-pay | Admitting: Cardiology

## 2016-10-11 NOTE — Progress Notes (Signed)
Pt presents with mycotic infection of nails 1-5 bilateral  All other systems are negative  Laser therapy administered to affected nails and tolerated well. All safety precautions were in place. Re-appinted in 4 weeks for 2nd treatment

## 2016-10-11 NOTE — Telephone Encounter (Signed)
Pt not sure if she has allergies/cold. Wants to know what is ok to take. Advised that she can take OTC, but to avoid decongestants and sudafed. Medication examples given that she can take - robitussin plain or coricidin cold with the heart on the box. Pt is going to start with allergy medication first. She thanks me for reviewing with her.

## 2016-10-11 NOTE — Telephone Encounter (Signed)
New message    Pt states she just woke up with a cold. She wants to know if she goes to the drugstore, can she take anything? Or does she have to get something for high blood pressure?

## 2016-10-13 ENCOUNTER — Other Ambulatory Visit
Admission: RE | Admit: 2016-10-13 | Discharge: 2016-10-13 | Disposition: A | Discharge status: Home / Self Care | Payer: PRIVATE HEALTH INSURANCE | Source: Ambulatory Visit | Attending: Cardiology | Admitting: Cardiology

## 2016-10-13 DIAGNOSIS — Z79899 Other long term (current) drug therapy: Secondary | ICD-10-CM | POA: Insufficient documentation

## 2016-10-13 DIAGNOSIS — I480 Paroxysmal atrial fibrillation: Secondary | ICD-10-CM | POA: Diagnosis not present

## 2016-10-13 LAB — BASIC METABOLIC PANEL
ANION GAP: 7 (ref 5–15)
BUN: 11 mg/dL (ref 6–20)
CO2: 27 mmol/L (ref 22–32)
Calcium: 8.6 mg/dL — ABNORMAL LOW (ref 8.9–10.3)
Chloride: 109 mmol/L (ref 101–111)
Creatinine, Ser: 0.57 mg/dL (ref 0.44–1.00)
GFR calc Af Amer: >60 mL/min (ref >60)
Glucose, Bld: 91 mg/dL (ref 65–99)
POTASSIUM: 3.8 mmol/L (ref 3.5–5.1)
SODIUM: 143 mmol/L (ref 135–145)

## 2016-10-17 ENCOUNTER — Telehealth: Payer: Self-pay | Admitting: Cardiology

## 2016-10-17 NOTE — Telephone Encounter (Signed)
New message     Pharmacy did not receive a  Prescription for the Xarelto     *STAT* If patient is at the pharmacy, call can be transferred to refill team.   1. Which medications need to be refilled? (please list name of each medication and dose if known)  xarelto  2. Which pharmacy/location (including street and city if local pharmacy) is medication to be sent to? [facility] in [place] on  [address]   3. Do they need a 30 day or 90 day supply?  [place]

## 2016-10-18 ENCOUNTER — Encounter: Payer: Self-pay | Admitting: *Deleted

## 2016-10-18 MED ORDER — RIVAROXABAN 20 MG PO TABS: 20.0000 mg | ORAL_TABLET | Freq: Every day | ORAL | 6 refills | Status: DC

## 2016-10-18 NOTE — Telephone Encounter (Signed)
Rx sent. Pt notified via MyChart. 

## 2016-10-23 ENCOUNTER — Ambulatory Visit: Payer: PRIVATE HEALTH INSURANCE | Attending: Obstetrics and Gynecology | Admitting: Physical Therapy

## 2016-10-23 ENCOUNTER — Encounter: Payer: Self-pay | Admitting: Physical Therapy

## 2016-10-23 VITALS — BP 112/62

## 2016-10-23 DIAGNOSIS — M6281 Muscle weakness (generalized): Secondary | ICD-10-CM | POA: Insufficient documentation

## 2016-10-23 DIAGNOSIS — R279 Unspecified lack of coordination: Secondary | ICD-10-CM | POA: Diagnosis not present

## 2016-10-23 NOTE — Patient Instructions (Addendum)
Out of chair every 45 min ___________  Minisquat: Scoot buttocks back slight, hinge like you are looking at your reflection on a pond  Knees behind toes,  Inhale to "smell flowers"  Exhale on the rise "like rocket"  Do not lock knees, have more weight across ballmounds of feet, toes relaxed   5 reps     ___  sidebend and arm swings 5    ___   Pelvic circles before the point of pain    ____   Avoid straining pelvic floor, abdominal muscles , spine  Use log rolling technique instead of getting out of bed with your neck or the sit-up   Log rolling out of .bed  L  arm overhead  Raise hips and scoot hips to R   Drop knees to L,  scooting L shoulder back to get completely on your L side so your shoulders, hips, and knees point to the L    Then breathe as you drop feet off bed and prop onto L elbow and  use both hands to push yourself

## 2016-10-24 ENCOUNTER — Encounter: Payer: Self-pay | Admitting: Radiology

## 2016-10-24 ENCOUNTER — Ambulatory Visit
Admission: RE | Admit: 2016-10-24 | Discharge: 2016-10-24 | Disposition: A | Discharge status: Home / Self Care | Payer: PRIVATE HEALTH INSURANCE | Source: Ambulatory Visit | Attending: Obstetrics and Gynecology | Admitting: Obstetrics and Gynecology

## 2016-10-24 DIAGNOSIS — Z1231 Encounter for screening mammogram for malignant neoplasm of breast: Secondary | ICD-10-CM | POA: Insufficient documentation

## 2016-10-24 NOTE — Therapy (Signed)
[place] MAIN [facility] SERVICES [address] [place], [place], [address] Phone: [phone]   Fax:  [phone]  Physical Therapy Evaluation  Patient Details  Name: Jessica Moody MRN: [id number] Date of Birth: 04-25-1965 Referring Provider: Leafy Ro   Encounter Date: 10/23/2016      PT End of Session - 10/24/16 1046    Visit Number 1   Number of Visits 12   Date for PT Re-Evaluation 01/15/17   PT Start Time 0810   PT Stop Time 0910   PT Time Calculation (min) 60 min   Activity Tolerance Patient tolerated treatment well;No increased pain   Behavior During Therapy WFL for tasks assessed/performed      Past Medical History:  Diagnosis Date  . Afib ([place])   . Cancer ([place])    colon  . Colon cancer ([place])    2015, 10 inches removed  . Depression   . Headache   . Heart murmur   . Hyperlipemia   . Hypertension   . Insomnia   . PONV (postoperative nausea and vomiting)     Past Surgical History:  Procedure Laterality Date  . APPENDECTOMY    . CESAREAN SECTION N/A    C-Section X 2  . COLON SURGERY    . COLONOSCOPY WITH PROPOFOL N/A 11/30/2014   Procedure: COLONOSCOPY WITH PROPOFOL;  Surgeon: Josefine Class, MD;  Location: [facility] ENDOSCOPY;  Service: Endoscopy;  Laterality: N/A;  . GASTRIC BYPASS  2016  . INCISIONAL HERNIA REPAIR Right   . LAPAROSCOPIC GASTRIC RESTRICTIVE DUODENAL PROCEDURE (DUODENAL SWITCH) N/A 06/30/2014   Procedure: LAPAROSCOPIC DUODENAL SWITCH WTIH BILIARY PANCREATIC DIVERSION ;  Surgeon: [name], MD;  Location: [facility] ORS;  Service: General;  Laterality: N/A;  . TONSILLECTOMY      Vitals:   10/23/16 0820  BP: 112/62         Subjective Assessment - 10/23/16 0821    Subjective 1) low abdominal/pelvic pain has occured across 4-5 x since Oct 17 during functional activities with getting up from a chair, bowel movements, pelvic gynecological exams, and sexual intercourse.  10 /10 described " like a dull knife  but not sharp, more like pulling".  Pt's MD has told her that she may have a hernia over her C-section scar. Pt has had diarrhea issues since her gastric bypass ( 2016).  Pt had pelvic pain in the past in 2015 that was different than pain she feels now. The pain in 2015 was constant and more like menstrual pain which led her gasterenterologist to find Stage I colon cancer. 10 inches of her colon and lymph nodes were removed. Pt has been getting tests every 6 months. Pt's MD mentioned to her that she had low motility. Pt had been trying to loose weight prior to the cancer Dx and surgery. Pt decided to go through  gastric bypass 8 months after the cancer surgery.  In March 2017, pt had a liver biopsy whic had negative findings.    2) bowel movements:  prior CA/ gastric bypass surgery, pt had Stool Type 3-4  occurring 1-2 BMs per every week.  Since the gastric bypass, her bowel movements occur 3-5 x/ day, Stool Type: 6-7 , floaty and loose but like "peanut butter".  Pt would like to get off of her the Creon medication . Low abdominal painoccurs with bowel movements 75%. Abdominal pain lingers sometimes after  bowel movements.  Denied hemorrhoids.  Pt is trying to be on the diet  that is recommended after the gastric by pass surgery. Pt has worked with nutritionist.      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 In 1999, pt worked with a Restaurant manager, fast food.  When she stands too long, her hips and back her.    Pertinent History Colon CA Hx, 2 C-section, resection of colon,gastric bypass surgery, incisional hernia R LQ, small fibroid and ovarian cyst. Recent Dx of A-fib. Had a fall onto her R hip in August. Occupation:  sitting.              [facility] PT Assessment - 10/24/16 1039      Assessment   Medical Diagnosis Vagissimus, Dyspareunia   Referring Provider Leafy Ro      Precautions   Precautions None     Restrictions   Weight Bearing Restrictions No     Balance Screen   Has the patient  fallen in the past 6 months No     Observation/Other Assessments   Observations ankles crossed, slumped position     Coordination   Gross Motor Movements are Fluid and Coordinated --  abdominal straining with cue for bowel movement    Fine Motor Movements are Fluid and Coordinated --  poor pelvic floor movement     Palpation   Spinal mobility L rotation w/ pulling over abdominal mm    SI assessment  R ASIS more posterior    Palpation comment minor scar restriction over abdominal scars      Bed Mobility   Bed Mobility --  crunch             Objective measurements completed on examination: See above findings.        Pelvic Floor Special Questions - 10/24/16 1043    Diastasis Recti neg          [facility] Adult PT Treatment/Exercise - 10/24/16 1039      Therapeutic Activites    Therapeutic Activities --  see pt instructions     Neuro Re-ed    Neuro Re-ed Details  see pt instructions                PT Education - 10/24/16 1045    Education provided Yes   Education Details POC, anatomy/physiology, goals, HEP, body mechanics   Person(s) Educated Patient   Methods Explanation;Demonstration;Tactile cues;Verbal cues;Handout   Comprehension Returned demonstration;Verbalized understanding             PT Long Term Goals - 10/24/16 1054      PT LONG TERM GOAL #1   Title Pt will report no pulling sensation in R abdominal area with L spinal rotation in order to turn body in functional activities   Time 4   Period Weeks   Status New     PT LONG TERM GOAL #2   Title Pt will demo no pelvic obliquties across two visits in order to promote proper pelvic floor function and return to ADLs   Time 6   Period Weeks   Status New     PT LONG TERM GOAL #3   Title Pt will report improved Stool Consistency from Type 6-7 to 3-5  or decreased frequency from 3-5 x/ day to 1-3 x day in order to participate in community events    Time 12   Period Weeks   Status New      PT LONG TERM GOAL #4   Title Pt will decrease her COREFO score from  34% to <29  %  in order to restore GI function   Time 12   Period Weeks   Status New     PT LONG TERM GOAL #5   Title Pt will report a decreased Lucky score from 50% to < 45 % in order to improve QOL   Time 12   Period Weeks   Status New                Plan - 10/24/16 1048    Clinical Impression Statement Pt is a 51 yo female who reports low abdominal/pelvic pain across the past 2 years after surgeries related to colon CA and gastric bypass. The pain impacts her during functional activities with getting up from a chair, bowel movements, pelvic gynecological exams, and sexual intercourse.  Pt also reports poor formed stools and bowel frequency after her gastric bypass and has had CLBP for over 14-15 years after a lifting injury. Pt's clinical presentations include dyscoordination and weakness of deep core mm, R low abdominal pulling with L spinal rotation, poor body mechanics, limited education on maintaining proper GI and bladder health, and poor posture. Following Tx, pt demo'd proper body mechanics to minimize strain on her pelvic floor.    History and Personal Factors relevant to plan of care: Colon CA Hx, 2 C-section, resection of colon,gastric bypass surgery, incisional hernia R LQ, small fibroid and ovarian cyst. Recent Dx of A-fib. Had a fall onto her R hip in August. Occupation:  sitting.     Clinical Presentation Unstable   Clinical Decision Making High   Rehab Potential Good   PT Frequency 1x / week   PT Duration 12 weeks   PT Treatment/Interventions ADLs/Self Care Home Management;Manual techniques;Therapeutic activities;Therapeutic exercise;Neuromuscular re-education;Moist Heat;Traction;Stair training;Functional mobility training;Gait training;Scar mobilization   Consulted and Agree with Plan of Care Patient      Patient will benefit from skilled therapeutic intervention in order to improve the  following deficits and impairments:  Pain, Postural dysfunction, Decreased strength, Decreased safety awareness, Decreased coordination, Decreased activity tolerance, Improper body mechanics, Difficulty walking, Decreased skin integrity, Decreased mobility, Decreased endurance, Decreased balance, Decreased range of motion, Hypomobility, Increased muscle spasms  Visit Diagnosis: Unspecified lack of coordination  Muscle weakness (generalized)     Problem List Patient Active Problem List   Diagnosis Date Noted  . 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 HLD (hyperlipidemia) 05/12/2015  . Morbid obesity due to excess calories ([place]) 06/30/2014  . Essential (primary) hypertension 04/03/2014  . Beat, premature ventricular 03/05/2014  . Major depressive disorder, single episode, moderate ([place]) 01/22/2014  . 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 Malignant neoplasm of sigmoid colon ([place]) 11/04/2013  . Other specified abnormal immunological findings in serum 10/30/2013  . 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 Adult BMI 30+ 10/24/2013    [name] ,PT, DPT, E-RYT  10/24/2016, 10:59 AM  [place] MAIN [facility] SERVICES [address] [place], [place], [address] Phone: [phone]   Fax:  [phone]  Name: Jessica Moody MRN: [id number] Date of Birth: Feb 06, 1965

## 2016-10-30 ENCOUNTER — Ambulatory Visit: Payer: PRIVATE HEALTH INSURANCE | Admitting: Physical Therapy

## 2016-10-30 DIAGNOSIS — R279 Unspecified lack of coordination: Secondary | ICD-10-CM | POA: Diagnosis not present

## 2016-10-30 DIAGNOSIS — M6281 Muscle weakness (generalized): Secondary | ICD-10-CM

## 2016-10-30 NOTE — Therapy (Signed)
[place] MAIN [facility] SERVICES [address] [place], [place], [address] Phone: [phone]   Fax:  [phone]  Physical Therapy Treatment  Patient Details  Name: Jessica Moody MRN: [id number] Date of Birth: 27-Jan-1965 Referring Provider: Leafy Ro   Encounter Date: 10/30/2016      PT End of Session - 10/30/16 0852    Visit Number 2   Number of Visits 12   Date for PT Re-Evaluation 01/15/17   PT Start Time 0804   PT Stop Time 0851   PT Time Calculation (min) 47 min   Activity Tolerance Patient tolerated treatment well;No increased pain   Behavior During Therapy WFL for tasks assessed/performed      Past Medical History:  Diagnosis Date  . Afib ([place])   . Cancer ([place])    colon  . Colon cancer ([place])    2015, 10 inches removed  . Depression   . Headache   . Heart murmur   . Hyperlipemia   . Hypertension   . Insomnia   . PONV (postoperative nausea and vomiting)     Past Surgical History:  Procedure Laterality Date  . APPENDECTOMY    . CESAREAN SECTION N/A    C-Section X 2  . COLON SURGERY    . COLONOSCOPY WITH PROPOFOL N/A 11/30/2014   Procedure: COLONOSCOPY WITH PROPOFOL;  Surgeon: Josefine Class, MD;  Location: [facility] ENDOSCOPY;  Service: Endoscopy;  Laterality: N/A;  . GASTRIC BYPASS  2016  . INCISIONAL HERNIA REPAIR Right   . LAPAROSCOPIC GASTRIC RESTRICTIVE DUODENAL PROCEDURE (DUODENAL SWITCH) N/A 06/30/2014   Procedure: LAPAROSCOPIC DUODENAL SWITCH WTIH BILIARY PANCREATIC DIVERSION ;  Surgeon: [name], MD;  Location: [facility] ORS;  Service: General;  Laterality: N/A;  . TONSILLECTOMY      There were no vitals filed for this visit.      Subjective Assessment - 10/30/16 0809    Subjective Pt had a chance to do some of the exercises at work and home. Pt has started to wear her fit bit. LASt night, she had a fit with her R hip and it hurt rolling onto the R side and the L side.     Pertinent History Colon CA Hx, 2  C-section, resection of colon,gastric bypass surgery, incisional hernia R LQ, small fibroid and ovarian cyst. Recent Dx of A-fib. Had a fall onto her R hip in August. Occupation:  sitting.              [facility] PT Assessment - 10/30/16 0829      Observation/Other Assessments   Observations genu valgus B , knees together in seated position     Strength   Overall Strength --  glut B 3+/5, abd 3+/5      Palpation   SI assessment  SIJ mobility limited on R into hip extension, hip flex/IR/add and hip flex/ abd/ER    Palpation comment looser skin around meidal aspect of R lower abdominal scar                  Pelvic Floor Special Questions - 10/30/16 6546    External Palpation through clothing, obt int R tightness and tenderness            [facility] Adult PT Treatment/Exercise - 10/30/16 0831      Neuro Re-ed    Neuro Re-ed Details  Alternate every 5 reps   ( 5 sets of frog for 5, prone heel press for 5) , clams 10  rep x 2 both sides  pillow under abdomen to relieve LBP       Exercises   Exercises --  see pt instructions      Manual Therapy   Manual therapy comments R long axis distraction, sidelying: rotational mob, inferior/superior mob of sacrum, MWM with hip abd/ ER                 PT Education - 10/30/16 0849    Education provided Yes   Education Details HEP   Person(s) Educated Patient   Methods Demonstration;Explanation;Tactile cues;Verbal cues;Handout   Comprehension Returned demonstration;Verbalized understanding             PT Long Term Goals - 10/24/16 1054      PT LONG TERM GOAL #1   Title Pt will report no pulling sensation in R abdominal area with L spinal rotation in order to turn body in functional activities   Time 4   Period Weeks   Status New     PT LONG TERM GOAL #2   Title Pt will demo no pelvic obliquties across two visits in order to promote proper pelvic floor function and return to ADLs   Time 6   Period Weeks    Status New     PT LONG TERM GOAL #3   Title Pt will report improved Stool Consistency from Type 6-7 to 3-5  or decreased frequency from 3-5 x/ day to 1-3 x day in order to participate in community events    Time 12   Period Weeks   Status New     PT LONG TERM GOAL #4   Title Pt will decrease her COREFO score from 34 % to < 29 % in order to restore GI function   Time 12   Period Weeks   Status New     PT LONG TERM GOAL #5   Title Pt will report a decreased [place] score from 50% to <45 % in order to improve QOL   Time 12   Period Weeks   Status New               Plan - 10/30/16 4403    Clinical Impression Statement Pt showed no pelvic obliquities and reported no puilling of R abdominal area with L trunk rotation  following Tx.  Anticipate this will help minimize her report of R hip issues 2/2 to her fall. Educated pt to sleep with pillow between her knees. Pt also demo'd less abdominal straining with toileting position which will help minimize the worsening of her incisional hernia.  Advanced to deep core strengthening today. Educated about proper alignment of feet, knees, hips in exercises in order to minimize and address genu valgus.  Added clam shells in open chain strengthening and glut strengthening in closed chain to strengthen weak hip muscles. in Pt had no complaints w/ knee pain during exercises but pt reports her knees bother her.  PT plans to continue addressing lower kinetic chain and perform pelvic assessment at next session.     Rehab Potential Good   PT Frequency 1x / week   PT Duration 12 weeks   PT Treatment/Interventions ADLs/Self Care Home Management;Manual techniques;Therapeutic activities;Therapeutic exercise;Neuromuscular re-education;Moist Heat;Traction;Stair training;Functional mobility training;Gait training;Scar mobilization   Consulted and Agree with Plan of Care Patient      Patient will benefit from skilled therapeutic intervention in order to improve  the following deficits and impairments:  Pain, Postural dysfunction, Decreased strength, Decreased safety awareness, Decreased  coordination, Decreased activity tolerance, Improper body mechanics, Difficulty walking, Decreased skin integrity, Decreased mobility, Decreased endurance, Decreased balance, Decreased range of motion, Hypomobility, Increased muscle spasms  Visit Diagnosis: Muscle weakness (generalized)  Unspecified lack of coordination     Problem List Patient Active Problem List   Diagnosis Date Noted  . 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 HLD (hyperlipidemia) 05/12/2015  . Morbid obesity due to excess calories ([place]) 06/30/2014  . Essential (primary) hypertension 04/03/2014  . Beat, premature ventricular 03/05/2014  . Major depressive disorder, single episode, moderate ([place]) 01/22/2014  . 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 Malignant neoplasm of sigmoid colon ([place]) 11/04/2013  . Other specified abnormal immunological findings in serum 10/30/2013  . 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 Adult BMI 30+ 10/24/2013    [name] ,PT, DPT, E-RYT  10/30/2016, 9:02 AM  [place] MAIN [facility] SERVICES [address] [place], [place], [address] Phone: [phone]   Fax:  [phone]  Name: YSENIA FILICE MRN: [id number] Date of Birth: 1965/10/17

## 2016-10-30 NOTE — Patient Instructions (Addendum)
WORK    Standing:  10 reps on both sides x 3 x day     3 point tap   Feet are hip width Tap forward, center under hip not feet next to each other  Tap middle\, center  Tap back      _________  HOME    1) Deep corel level -1  10 reps  2) Deep core level 2  - 6 min in morning and again at night on floor     3) Clam Shell 45 Degrees   Lying with hips and knees bent 45, one pillow between knees and ankles. Lift knee with exhale. Be sure pelvis does not roll backward. Do not arch back. Do 10 times, each leg, 2 times per day.     _  Bowel movements: expand ribs with breath and not into chest  ( to minimize strain on abdomen)   Log roll out of bed  to minimize strain on abdomen

## 2016-11-06 ENCOUNTER — Ambulatory Visit: Payer: PRIVATE HEALTH INSURANCE | Admitting: Physical Therapy

## 2016-11-06 DIAGNOSIS — M6281 Muscle weakness (generalized): Secondary | ICD-10-CM

## 2016-11-06 DIAGNOSIS — R279 Unspecified lack of coordination: Secondary | ICD-10-CM | POA: Diagnosis not present

## 2016-11-06 NOTE — Patient Instructions (Addendum)
Yellow band under heels while laying on back w/ knees bent  "W" exercise  10 reps x 2 sets   Band is placed under feet, knees bent, feet are hip width apart Hold band with thumbs point out, keep upper arm and elbow touching the bed the whole time  - inhale and then exhale pull bands by bending elbows hands move in a "w"  (feel shoulder blades squeezing)   _______  Discontinue the frog   Instead Stretch for pelvic floor   "v heels slide away and then back toward buttocks and then rock knee to slight ,  slide heel along at 11 o clock away from buttocks   10 reps   _________   Keep up with deep core 1 and 2    _________   Handout for office ergonomic set up  ( raise computer screen on a book to keep upright posture and not lean in)     Standing 3-taps plus figure and tap back

## 2016-11-06 NOTE — Therapy (Signed)
[place] MAIN [facility] SERVICES [address] [place], [place], [address] Phone: [phone]   Fax:  [phone]  Physical Therapy Treatment  Patient Details  Name: Jessica Moody MRN: [id number] Date of Birth: 12-30-1965 Referring Provider: Leafy Ro   Encounter Date: 11/06/2016      PT End of Session - 11/06/16 0905    Visit Number 3   Number of Visits 12   Date for PT Re-Evaluation 01/15/17   PT Start Time 0807   PT Stop Time 7169   PT Time Calculation (min) 48 min   Activity Tolerance Patient tolerated treatment well;No increased pain   Behavior During Therapy WFL for tasks assessed/performed      Past Medical History:  Diagnosis Date  . Afib ([place])   . Cancer ([place])    colon  . Colon cancer ([place])    2015, 10 inches removed  . Depression   . Headache   . Heart murmur   . Hyperlipemia   . Hypertension   . Insomnia   . PONV (postoperative nausea and vomiting)     Past Surgical History:  Procedure Laterality Date  . APPENDECTOMY    . CESAREAN SECTION N/A    C-Section X 2  . COLON SURGERY    . COLONOSCOPY WITH PROPOFOL N/A 11/30/2014   Procedure: COLONOSCOPY WITH PROPOFOL;  Surgeon: Josefine Class, MD;  Location: [facility] ENDOSCOPY;  Service: Endoscopy;  Laterality: N/A;  . GASTRIC BYPASS  2016  . INCISIONAL HERNIA REPAIR Right   . LAPAROSCOPIC GASTRIC RESTRICTIVE DUODENAL PROCEDURE (DUODENAL SWITCH) N/A 06/30/2014   Procedure: LAPAROSCOPIC DUODENAL SWITCH WTIH BILIARY PANCREATIC DIVERSION ;  Surgeon: [name], MD;  Location: [facility] ORS;  Service: General;  Laterality: N/A;  . TONSILLECTOMY      There were no vitals filed for this visit.      Subjective Assessment - 11/06/16 0812    Subjective Pt reports she is able to fall aslseep on her R side and that is an improvement.    Pertinent History Colon CA Hx, 2 C-section, resection of colon,gastric bypass surgery, incisional hernia R LQ, small fibroid and ovarian cyst.  Recent Dx of A-fib. Had a fall onto her R hip in August. Occupation:  sitting.              [facility] PT Assessment - 11/06/16 0839      Observation/Other Assessments   Observations no cues for properly aligned knees in sitting . cues required for deep core exercise      Coordination   Gross Motor Movements are Fluid and Coordinated --   poor lower kinetic chain co-activation      Palpation   SI assessment  Tightness and tenderness at R obt int, hypomobility along distal SIJ                       [facility] Adult PT Treatment/Exercise - 11/06/16 0840      Neuro Re-ed    Neuro Re-ed Details  see pt instructions      Moist Heat Therapy   Number Minutes Moist Heat --  5 min gluts    Moist Heat Location Other (comment)     Manual Therapy   Manual therapy comments R long axis distraction, supine: MWM with STM at obt tubercle and SIJ                 PT Education - 11/06/16 0905    Education provided  Yes   Education Details HEP   Person(s) Educated Patient   Methods Explanation;Demonstration;Tactile cues;Verbal cues;Handout   Comprehension Returned demonstration;Verbalized understanding             PT Long Term Goals - 10/24/16 1054      PT LONG TERM GOAL #1   Title Pt will report no pulling sensation in R abdominal area with L spinal rotation in order to turn body in functional activities   Time 4   Period Weeks   Status New     PT LONG TERM GOAL #2   Title Pt will demo no pelvic obliquties across two visits in order to promote proper pelvic floor function and return to ADLs   Time 6   Period Weeks   Status New     PT LONG TERM GOAL #3   Title Pt will report improved Stool Consistency from Type 6-7 to 3-5  or decreased frequency from 3-5 x/ day to 1-3 x day in order to participate in community events    Time 12   Period Weeks   Status New     PT LONG TERM GOAL #4   Title Pt will decrease her COREFO score from 34 % to < 29 % in order to  restore GI function   Time 12   Period Weeks   Status New     PT LONG TERM GOAL #5   Title Pt will report a decreased [place] score from 50% to <45 % in order to improve QOL   Time 12   Period Weeks   Status New               Plan - 11/06/16 0905    Clinical Impression Statement Pt demo'd less SIJ hypomobility and tensions at R obturator mm post Tx today. Refined technique with deep core level 2 with co-activation of lower kinetich chain. Advanced to thoracolumbar strengthening with yellow band in hooklying position.  Added standing pelvic floor stretch and ergonomic education for work setting.  Pt reports being able to lay on her R side and fall asleep which is an improvement. Pt stated she is able to feel her abdominal muscles working in new HEP. Pt continues to benefit from skilled PT.    Rehab Potential Good   PT Frequency 1x / week   PT Duration 12 weeks   PT Treatment/Interventions ADLs/Self Care Home Management;Manual techniques;Therapeutic activities;Therapeutic exercise;Neuromuscular re-education;Moist Heat;Traction;Stair training;Functional mobility training;Gait training;Scar mobilization   Consulted and Agree with Plan of Care Patient      Patient will benefit from skilled therapeutic intervention in order to improve the following deficits and impairments:  Pain, Postural dysfunction, Decreased strength, Decreased safety awareness, Decreased coordination, Decreased activity tolerance, Improper body mechanics, Difficulty walking, Decreased skin integrity, Decreased mobility, Decreased endurance, Decreased balance, Decreased range of motion, Hypomobility, Increased muscle spasms  Visit Diagnosis: Muscle weakness (generalized)  Unspecified lack of coordination     Problem List Patient Active Problem List   Diagnosis Date Noted  . 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 HLD (hyperlipidemia) 05/12/2015  . Morbid obesity due to excess calories ([place])  06/30/2014  . Essential (primary) hypertension 04/03/2014  . Beat, premature ventricular 03/05/2014  . Major depressive disorder, single episode, moderate ([place]) 01/22/2014  . 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 Malignant neoplasm of sigmoid colon ([place]) 11/04/2013  . Other specified abnormal immunological findings in serum 10/30/2013  . 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 Adult BMI 30+  10/24/2013    [name] ,PT, DPT, E-RYT  11/06/2016, 9:08 AM  [place] MAIN [facility] SERVICES [address] [place], [place], [address] Phone: [phone]   Fax:  [phone]  Name: ESTEPHANIA LICCIARDI MRN: [id number] Date of Birth: 1965-05-09

## 2016-11-09 ENCOUNTER — Ambulatory Visit (INDEPENDENT_AMBULATORY_CARE_PROVIDER_SITE_OTHER): Payer: PRIVATE HEALTH INSURANCE | Admitting: Podiatry

## 2016-11-09 DIAGNOSIS — B351 Tinea unguium: Secondary | ICD-10-CM

## 2016-11-09 DIAGNOSIS — L603 Nail dystrophy: Secondary | ICD-10-CM

## 2016-11-09 NOTE — Progress Notes (Signed)
Pt presents with mycotic infection of nails 1-5 bilateral  All other systems are negative  Laser therapy administered to affected nails and tolerated well. All safety precautions were in place. Re-appinted in 4 weeks for 3rd treatment

## 2016-11-13 ENCOUNTER — Ambulatory Visit: Payer: PRIVATE HEALTH INSURANCE | Admitting: Physical Therapy

## 2016-11-13 ENCOUNTER — Telehealth: Payer: Self-pay | Admitting: Cardiology

## 2016-11-13 ENCOUNTER — Encounter: Payer: Self-pay | Admitting: Cardiology

## 2016-11-13 VITALS — BP 122/68

## 2016-11-13 DIAGNOSIS — M6281 Muscle weakness (generalized): Secondary | ICD-10-CM

## 2016-11-13 DIAGNOSIS — R279 Unspecified lack of coordination: Secondary | ICD-10-CM | POA: Diagnosis not present

## 2016-11-13 NOTE — Telephone Encounter (Signed)
Reviewed pt MyChart message and this phone call concerns. Pt tells me she is doing better today. She was out of rhythm for about 12 hours - states she noticed heart racing when she laid down Saturday and still going when she woke on Sunday. Back in rhythm now. Pt is going to continue to monitor and call office if symptoms begin and/or she is in AFib for more than 24-48 hours. Dr. Curt Bears aware.

## 2016-11-13 NOTE — Patient Instructions (Signed)
    [email or web address]    Prior to eating:  6 directions of the neck  and 3 directions of jaw ( 3 reps)  2 mins of opposite knee to elbow and then seated rest with 5 slow breaths as a cool down  ( slow and not fast, have a chair behind you in case you need to sit) x 3 x in the work day.    Chew well Enjoy food  Do not multitask  After the meal: gentle colon massage     On work breaks: 6 directions of the spine and neck Heel lifts  10 reps  2 mins of opposite knee to elbow and then seated rest with 5 slow breaths as a cool ( slow and not fast, have a chair behind you in case you need to sit) x 3 x in the work day   seated Knees side to side Pelvic tilts   _____________  Consult your medical doctor about getting screened for sleep apnea , given your recent A-fib episodes  [email or web address]  [email or web address]

## 2016-11-13 NOTE — Telephone Encounter (Signed)
Jessica Moody is calling because she had some episodes over the weekend. Her heart was racing again , it did it for a longer period of time( It did level off) also had a couple of dizzy spells prior to these episodes . She also Documented her blood pressure and heart rate , which she will send through e-mail on My Chart . Please call

## 2016-11-14 NOTE — Therapy (Signed)
[place] MAIN [facility] SERVICES [address] [place], [place], [address] Phone: [phone]   Fax:  [phone]  Physical Therapy Treatment  Patient Details  Name: Jessica Moody MRN: [id number] Date of Birth: [birth year] Referring Provider: Leafy Ro   Encounter Date: 11/13/2016      PT End of Session - 11/13/16 0815    Visit Number 4   Number of Visits 12   Date for PT Re-Evaluation 01/15/17   PT Start Time 0807   PT Stop Time 0913   PT Time Calculation (min) 66 min   Activity Tolerance Patient tolerated treatment well;No increased pain   Behavior During Therapy WFL for tasks assessed/performed      Past Medical History:  Diagnosis Date  . Afib ([place])   . Cancer ([place])    colon  . Colon cancer ([place])    2015, 10 inches removed  . Depression   . Headache   . Heart murmur   . Hyperlipemia   . Hypertension   . Insomnia   . PONV (postoperative nausea and vomiting)     Past Surgical History:  Procedure Laterality Date  . APPENDECTOMY    . CESAREAN SECTION N/A    C-Section X 2  . COLON SURGERY    . COLONOSCOPY WITH PROPOFOL N/A 11/30/2014   Procedure: COLONOSCOPY WITH PROPOFOL;  Surgeon: Josefine Class, MD;  Location: [facility] ENDOSCOPY;  Service: Endoscopy;  Laterality: N/A;  . GASTRIC BYPASS  2016  . INCISIONAL HERNIA REPAIR Right   . LAPAROSCOPIC GASTRIC RESTRICTIVE DUODENAL PROCEDURE (DUODENAL SWITCH) N/A 06/30/2014   Procedure: LAPAROSCOPIC DUODENAL SWITCH WTIH BILIARY PANCREATIC DIVERSION ;  Surgeon: [name], MD;  Location: [facility] ORS;  Service: General;  Laterality: N/A;  . TONSILLECTOMY      Vitals:   11/13/16 0910  BP: 122/68        Subjective Assessment - 11/13/16 0813    Subjective Pt reported she had questions about the "w" exercise and had issues holding the band down with her feet . Pt noticed her hips/ back were hurting after standing on her feet 3 days in row.  Besides that time, pt's back has not been  hurting her.  Over the nights when she had been involved with activities, pt's HR increased that night to 145 bpm. Pt plans to contact her cardiologist        Pertinent History Colon CA Hx, 2 C-section, resection of colon,gastric bypass surgery, incisional hernia R LQ, small fibroid and ovarian cyst. Recent Dx of A-fib. Had a fall onto her R hip in August. Occupation:  sitting.              [facility] PT Assessment - 11/14/16 1452      Palpation   SI assessment  SIJ normal alignment mobility, no tensions of mm   Palpation comment facilitated abdominal stretches with lower trunk rotation exercise, pt reported loosening of L low abdominal area                      [facility] Adult PT Treatment/Exercise - 11/14/16 1454      Neuro Re-ed    Neuro Re-ed Details  education on GI system, mindful eatning for proper digestion and motility, nervous system, mobility exercises      Exercises   Exercises --  see pt instructions      Manual Therapy   Manual therapy comments light fascial releases over abdomen with MWM  PT Long Term Goals - 11/13/16 0817      PT LONG TERM GOAL #1   Title Pt will report no pulling sensation in R abdominal area with L spinal rotation in order to turn body in functional activities   Time 4   Period Weeks   Status Partially Met     PT LONG TERM GOAL #2   Title Pt will demo no pelvic obliquties across two visits in order to promote proper pelvic floor function and return to ADLs   Time 6   Period Weeks   Status Achieved     PT LONG TERM GOAL #3   Title Pt will report improved Stool Consistency from Type 6-7 to 3-5  or decreased frequency from 3-5 x/ day to 1-3 x day in order to participate in community events    Time 12   Period Weeks   Status On-going     PT LONG TERM GOAL #4   Title Pt will decrease her COREFO score from 34 % to < 29 % in order to restore GI function   Time 12   Period Weeks   Status On-going      PT LONG TERM GOAL #5   Title Pt will report a decreased [place] score from 50% to <45 % in order to improve QOL   Time 12   Period Weeks   Status On-going               Plan - 11/13/16 0910    Clinical Impression Statement Pt has achieved 1/5 goals at her 4th visits today and is progressing well towards her remaining goals related to pelvic floor function.  Pt continues to have reduced LBP. Pt demo'd a more symmetrically aligned pelvic girdle and less pelvic floor mm tensions today.  Added abdominopelvic mobility HEP and education on promoting GI motility.  Added standing marches for 2 min to her HEP to promote circulation given her sedentary job.   Monitored her HR  given her report of cardiac conditions. In supine pre-exercise, her HR was 60 bpm, post -exercise:  seated after cool down: 64 bpm.  Pt denied any symptoms during and after Tx. Plan to continue addressing her remaining goals at future visits. Recommended pt to ensure seated resting periods when she is involved with increased long periods of activities in the community over the weekend. Pt voiced understanding and she continues to monitor her HR and plan to communicate w/ her cardiologist today about her increased HR episode from over this weekend.    Withholding rectal assessment until pt has seen her GI doctor ( scheduled for this week).  Plan to communicate with GI doctor re: POC and make changes if need. Continue to monitor her for her cardiac issues and communicate with her cardiologist when upgrading her exercises to receive clearance or understand any precautions.     Pt was also provided information about relationship of A-fib and OSA. Pt may benefit from getting a sleep study due to her risk factors and the fact that she has not had an updated study since prior to her gastric by-pass surgery. Pt continues to benefit from skilled PT.      Rehab Potential Good   PT Frequency 1x / week   PT Duration 12 weeks   PT  Treatment/Interventions ADLs/Self Care Home Management;Manual techniques;Therapeutic activities;Therapeutic exercise;Neuromuscular re-education;Moist Heat;Traction;Stair training;Functional mobility training;Gait training;Scar mobilization   Consulted and Agree with Plan of Care Patient      Patient  will benefit from skilled therapeutic intervention in order to improve the following deficits and impairments:  Pain, Postural dysfunction, Decreased strength, Decreased safety awareness, Decreased coordination, Decreased activity tolerance, Improper body mechanics, Difficulty walking, Decreased skin integrity, Decreased mobility, Decreased endurance, Decreased balance, Decreased range of motion, Hypomobility, Increased muscle spasms  Visit Diagnosis: Muscle weakness (generalized)  Unspecified lack of coordination     Problem List Patient Active Problem List   Diagnosis Date Noted  . 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 HLD (hyperlipidemia) 05/12/2015  . Morbid obesity due to excess calories ([place]) 06/30/2014  . Essential (primary) hypertension 04/03/2014  . Beat, premature ventricular 03/05/2014  . Major depressive disorder, single episode, moderate ([place]) 01/22/2014  . 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 Malignant neoplasm of sigmoid colon ([place]) 11/04/2013  . Other specified abnormal immunological findings in serum 10/30/2013  . 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 Adult BMI 30+ 10/24/2013    [name] ,PT, DPT, E-RYT  11/14/2016, 3:08 PM  [place] MAIN [facility] SERVICES [address] [place], [place], [address] Phone: [phone]   Fax:  [phone]  Name: KAWANA HEGEL MRN: [id number] Date of Birth: 05/03/1965

## 2016-11-27 ENCOUNTER — Ambulatory Visit: Payer: PRIVATE HEALTH INSURANCE | Attending: Obstetrics and Gynecology | Admitting: Physical Therapy

## 2016-11-27 ENCOUNTER — Encounter: Payer: Self-pay | Admitting: Physical Therapy

## 2016-11-27 DIAGNOSIS — R279 Unspecified lack of coordination: Secondary | ICD-10-CM | POA: Diagnosis present

## 2016-11-27 DIAGNOSIS — M6281 Muscle weakness (generalized): Secondary | ICD-10-CM

## 2016-11-27 NOTE — Patient Instructions (Signed)
Pelvic floor stretches  Happy baby  5 reps , rocking    Childs pose 5 breaths , pillowunder belly    childs pose rocking 10 reps    At work: Figure stretch in seat or standing squat  5 breaths

## 2016-11-28 NOTE — Therapy (Signed)
[place] MAIN [facility] SERVICES [address] [place], [place], [address] Phone: [phone]   Fax:  [phone]  Physical Therapy Treatment  Patient Details  Name: Jessica Moody MRN: [id number] Date of Birth: 05-Apr-1965 Referring Provider: Leafy Ro    Encounter Date: 11/27/2016  PT End of Session - 11/27/16 0916    Visit Number  5    Number of Visits  12    Date for PT Re-Evaluation  01/15/17    PT Start Time  0804    PT Stop Time  0912    PT Time Calculation (min)  68 min    Activity Tolerance  Patient tolerated treatment well;No increased pain    Behavior During Therapy  WFL for tasks assessed/performed       Past Medical History:  Diagnosis Date  . Afib ([place])   . Cancer ([place])    colon  . Colon cancer ([place])    2015, 10 inches removed  . Depression   . Headache   . Heart murmur   . Hyperlipemia   . Hypertension   . Insomnia   . PONV (postoperative nausea and vomiting)     Past Surgical History:  Procedure Laterality Date  . APPENDECTOMY    . CESAREAN SECTION N/A    C-Section X 2  . COLON SURGERY    . GASTRIC BYPASS  2016  . INCISIONAL HERNIA REPAIR Right   . TONSILLECTOMY      There were no vitals filed for this visit.  Subjective Assessment - 11/27/16 0814    Subjective  Pt reported her back and hips has been better after standing for long periods. Pt has not noticed abdominal pain with bowel movements which are still loose. Pt saw her GI doctor who prescribed her medications to make her stools less loose. Pt has had pain with bowel movements in the past when they were bulkier. Pt will be seeing her gynecologist next week to f/u with her pain with intercourse and the hernia by her incision scar.    Pertinent History  Colon CA Hx, 2 C-section, resection of colon,gastric bypass surgery, incisional hernia R LQ, small fibroid and ovarian cyst. Recent Dx of A-fib. Had a fall onto her R hip in August. Occupation:  sitting.                      Pelvic Floor Special Questions - 11/28/16 0920    Pelvic Floor Internal Exam  pt consented verablly without contrainidcations after being explained details to exam     Exam Type  Vaginal    Palpation  tightness at R puborectalis ( anterior) and L iliococcgyeus/ obt int ( decreased post Tx)     Strength  fair squeeze, definite lift initially a flicker, required toning to activate muscles     initially a flicker, required toning to activate muscles     Strength # of reps  3    Strength # of seconds  3        [facility] Adult PT Treatment/Exercise - 11/28/16 0920      Exercises   Exercises  -- see pt instructions   see pt instructions     Moist Heat Therapy   Moist Heat Location  Other (comment) back.15 min during Tx, perineal 5 min    back.15 min during Tx, perineal 5 min      Manual Therapy   Internal Pelvic Floor  thiele massage at problem areas noted  in assessment              PT Education - 11/28/16 0921    Education provided  Yes    Education Details  HEP, education on coordination of pelvic floor for functional activities    Person(s) Educated  Patient    Methods  Explanation    Comprehension  Verbalized understanding;Verbal cues required;Tactile cues required          PT Long Term Goals - 11/27/16 0818      PT LONG TERM GOAL #1   Title  Pt will report no pulling sensation in R abdominal area with L spinal rotation in order to turn body in functional activities    Time  4    Period  Weeks    Status  Achieved      PT LONG TERM GOAL #2   Title  Pt will demo no pelvic obliquties across two visits in order to promote proper pelvic floor function and return to ADLs    Time  6    Period  Weeks    Status  Achieved      PT LONG TERM GOAL #3   Title  Pt will report improved Stool Consistency from Type 6-7 to 3-5  or decreased frequency from 3-5 x/ day to 1-3 x day in order to participate in community events     Time  12    Period  Weeks     Status  On-going      PT LONG TERM GOAL #4   Title  Pt will decrease her COREFO score from 34 % to < 29 % in order to restore GI function    Time  12    Period  Weeks    Status  On-going      PT LONG TERM GOAL #5   Title  Pt will report a decreased [employer] score from 50% to <45 % in order to improve QOL    Time  12    Period  Weeks    Status  On-going            Plan - 11/28/16 7619    Clinical Impression Statement Pt no longer has R abdominal pulling pain with trunk rotation. Pt has been maintaining equal alignment of pelvic girdle and continues to have decreased LBP with standing for long periods. These improvements deemed pt ready to be assessed and treated for pelvic floor dysfunctions with intravaginal exam. Pt demo'd decreased pelvic floor mm tensions and tenderness following intravaginal manual Tx. Pt was explained that her pelvic pain with intercourse will improve with more Tx and that prior Tx was not focused on pelvic floor Sx yet because other musculoskeletal improvements ( LBP)  had to be made first in order to yield lasting benefits with pelvic floor function. Pt voiced understanding. Pt was explained the hernia near her abdominal incision site will likely also continue to improve with deep core exercises and practicing body mechanics that avoid abdominal straining. Pt voiced understanding.   Pt is taking medications as prescribed by her GI doctor to help with stool consistency.  Pt continues to improve with increased deep core strength with exercise which will help with improving her motility in addition to helping with her spinal function and overall pelvic floor function for urination, defecation, sexual function, and core stability.  Pt will benefit from skilled PT to continue advancing towards her goals.      Rehab Potential  Good  PT Frequency  1x / week    PT Duration  12 weeks    PT Treatment/Interventions  ADLs/Self Care Home Management;Manual  techniques;Therapeutic activities;Therapeutic exercise;Neuromuscular re-education;Moist Heat;Traction;Stair training;Functional mobility training;Gait training;Scar mobilization    Consulted and Agree with Plan of Care  Patient       Patient will benefit from skilled therapeutic intervention in order to improve the following deficits and impairments:  Pain, Postural dysfunction, Decreased strength, Decreased safety awareness, Decreased coordination, Decreased activity tolerance, Improper body mechanics, Difficulty walking, Decreased skin integrity, Decreased mobility, Decreased endurance, Decreased balance, Decreased range of motion, Hypomobility, Increased muscle spasms  Visit Diagnosis: Muscle weakness (generalized)  Unspecified lack of coordination     Problem List Patient Active Problem List   Diagnosis Date Noted  . 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 HLD (hyperlipidemia) 05/12/2015  . Morbid obesity due to excess calories ([place]) 06/30/2014  . Essential (primary) hypertension 04/03/2014  . Beat, premature ventricular 03/05/2014  . Major depressive disorder, single episode, moderate ([place]) 01/22/2014  . 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 Malignant neoplasm of sigmoid colon ([place]) 11/04/2013  . Other specified abnormal immunological findings in serum 10/30/2013  . 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 Adult BMI 30+ 10/24/2013    [name] ,PT, DPT, E-RYT  11/28/2016, 9:31 AM  [place] MAIN [facility] SERVICES [address] [place], [place], [address] Phone: [phone]   Fax:  [phone]  Name: IVEY NEMBHARD MRN: [id number] Date of Birth: [birth year]

## 2016-12-04 ENCOUNTER — Ambulatory Visit (INDEPENDENT_AMBULATORY_CARE_PROVIDER_SITE_OTHER): Payer: Self-pay | Admitting: Podiatry

## 2016-12-04 DIAGNOSIS — B351 Tinea unguium: Secondary | ICD-10-CM

## 2016-12-04 DIAGNOSIS — L603 Nail dystrophy: Secondary | ICD-10-CM

## 2016-12-05 ENCOUNTER — Other Ambulatory Visit: Payer: PRIVATE HEALTH INSURANCE

## 2016-12-07 ENCOUNTER — Ambulatory Visit: Payer: PRIVATE HEALTH INSURANCE | Admitting: Physical Therapy

## 2016-12-07 DIAGNOSIS — R279 Unspecified lack of coordination: Secondary | ICD-10-CM

## 2016-12-07 DIAGNOSIS — M6281 Muscle weakness (generalized): Secondary | ICD-10-CM | POA: Diagnosis not present

## 2016-12-07 NOTE — Patient Instructions (Addendum)
Progress to deep core level 3 ( small lifting of foot, without rocking the pelvis nor arching the back)  6 min    ____  "Drawing a sword" " pulling a lawnmover"   Band under both feet, R hand holds end of the band that wraps the outside of L thigh Keep thigh aligned with toes and not let them move inward,  Keep elbow by your ribs  10 x 2 reps   ________   To correct the R pubic bone   Hooklying, R foot presses down, L hand press into L thigh as you bring L thigh towards L: palm ( push against )  5 sec holds x 5

## 2016-12-07 NOTE — Progress Notes (Signed)
Pt presents with mycotic infection of nails 1-5 bilateral  All other systems are negative  Laser therapy administered to affected nails and tolerated well. All safety precautions were in place. Re-appinted in 4 weeks for 4th treatment

## 2016-12-08 NOTE — Therapy (Signed)
[place] MAIN [facility] SERVICES [address] [place], [place], [address] Phone: [phone]   Fax:  [phone]  Physical Therapy Treatment  Patient Details  Name: Jessica Moody MRN: [id number] Date of Birth: 08-23-1965 Referring Provider: Leafy Ro    Encounter Date: 12/07/2016  PT End of Session - 12/07/16 1607    Visit Number  6    Number of Visits  12    Date for PT Re-Evaluation  01/15/17    PT Start Time  1502    PT Stop Time  1608    PT Time Calculation (min)  66 min    Activity Tolerance  Patient tolerated treatment well;No increased pain    Behavior During Therapy  WFL for tasks assessed/performed       Past Medical History:  Diagnosis Date  . Afib ([place])   . Cancer ([place])    colon  . Colon cancer ([place])    2015, 10 inches removed  . Depression   . Headache   . Heart murmur   . Hyperlipemia   . Hypertension   . Insomnia   . PONV (postoperative nausea and vomiting)     Past Surgical History:  Procedure Laterality Date  . APPENDECTOMY    . CESAREAN SECTION N/A    C-Section X 2  . COLON SURGERY    . COLONOSCOPY WITH PROPOFOL N/A 11/30/2014   Performed by Josefine Class, MD at [place]  . GASTRIC BYPASS  2016  . INCISIONAL HERNIA REPAIR Right   . LAPAROSCOPIC DUODENAL SWITCH WTIH BILIARY PANCREATIC DIVERSION N/A 06/30/2014   Performed by [name], MD at [facility]S  . TONSILLECTOMY      There were no vitals filed for this visit.  Subjective Assessment - 12/07/16 1508    Subjective  Pt had a breath test and afterwards, she felt bad in her upper stomach all day. Pt placed a heating pad over it and when she woke up the next morning, she felt ok.  She noticed the R groin. abdominal as well. The R low abdominal pain feels like something does not belong there , especially when she is upright. This hurts more when she stands up, almost like something is pulling.      Pertinent History  Colon CA Hx, 2 C-section,  resection of colon,gastric bypass surgery, incisional hernia R LQ, small fibroid and ovarian cyst. Recent Dx of A-fib. Had a fall onto her R hip in August. Occupation:  sitting.                     Pelvic Floor Special Questions - 12/08/16 1637    Pelvic Floor Internal Exam  pt consented verablly without contrainidcations after being explained details to exam     Exam Type  Vaginal    Palpation  minor tightness at R obt int ( decreased post Tx)     Strength  good squeeze, good lift, able to hold agaisnt strong resistance        [facility] Adult PT Treatment/Exercise - 12/08/16 1637      Manual Therapy   Manual therapy comments  MET for realignment of pubic symphysis, fascial mobilization towards umbilicus from R iliac crest     Internal Pelvic Floor  STM at R obt int              PT Education - 12/07/16 1607    Education provided  Yes    Education Details  HEP    Person(s) Educated  Patient    Methods  Explanation;Demonstration;Tactile cues;Verbal cues;Handout    Comprehension  Verbalized understanding;Returned demonstration          PT Long Term Goals - 11/27/16 0818      PT LONG TERM GOAL #1   Title  Pt will report no pulling sensation in R abdominal area with L spinal rotation in order to turn body in functional activities    Time  4    Period  Weeks    Status  Achieved      PT LONG TERM GOAL #2   Title  Pt will demo no pelvic obliquties across two visits in order to promote proper pelvic floor function and return to ADLs    Time  6    Period  Weeks    Status  Achieved      PT LONG TERM GOAL #3   Title  Pt will report improved Stool Consistency from Type 6-7 to 3-5  or decreased frequency from 3-5 x/ day to 1-3 x day in order to participate in community events     Time  12    Period  Weeks    Status  On-going      PT LONG TERM GOAL #4   Title  Pt will decrease her COREFO score from 34 % to < 29 % in order to restore GI function    Time  12     Period  Weeks    Status  On-going      PT LONG TERM GOAL #5   Title  Pt will report a decreased PDI score from 50% to <45 % in order to improve QOL    Time  12    Period  Weeks    Status  On-going            Plan - 12/08/16 1654    Clinical Impression Statement  Pt showed pubic symphysis malalignment ( R more posterior) and fascial tensions on R at low abdominal region which may be contributing to her c/o R abdominal pain with upright positions. Pt reported no tenderness at pubic symphysis on R and less tenderness at R LQ of abdomen in upright position following Tx. Pt demo'd improved pelvic floor mobility with co-activation of abdominal mm following Tx as well. Progressed pt to oblique mm strengthening in hooklying position to strengthen abdominal wall. Pt had no c/o with new exericises. Pt continues to benefit from skilled PT. Educated pt to contact her medical providers immediately if she has any nausea, vomitting, blood in her stool, sweating and other red flag signs. Pt voiced understanding. Pt continues to benefit from skilled PT to address abdominal wall weakness and improve pelvic floor function with other deep core mm to increase intraabdominal pressure system for abdominaopelvic support. Plan to refer pt to MD if pt's issues are not improving with musculoskeletal Tx.    Rehab Potential  Good    PT Frequency  1x / week    PT Duration  12 weeks    PT Treatment/Interventions  ADLs/Self Care Home Management;Manual techniques;Therapeutic activities;Therapeutic exercise;Neuromuscular re-education;Moist Heat;Traction;Stair training;Functional mobility training;Gait training;Scar mobilization    Consulted and Agree with Plan of Care  Patient       Patient will benefit from skilled therapeutic intervention in order to improve the following deficits and impairments:  Pain, Postural dysfunction, Decreased strength, Decreased safety awareness, Decreased coordination, Decreased activity  tolerance, Improper body mechanics, Difficulty walking, Decreased skin   integrity, Decreased mobility, Decreased endurance, Decreased balance, Decreased range of motion, Hypomobility, Increased muscle spasms  Visit Diagnosis: Muscle weakness (generalized)  Unspecified lack of coordination     Problem List Patient Active Problem List   Diagnosis Date Noted  . 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 HLD (hyperlipidemia) 05/12/2015  . Morbid obesity due to excess calories ([place]) 06/30/2014  . Essential (primary) hypertension 04/03/2014  . Beat, premature ventricular 03/05/2014  . Major depressive disorder, single episode, moderate ([place]) 01/22/2014  . 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 Malignant neoplasm of sigmoid colon ([place]) 11/04/2013  . Other specified abnormal immunological findings in serum 10/30/2013  . 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 Adult BMI 30+ 10/24/2013    [name] ,PT, DPT, E-RYT  12/08/2016, 4:55 PM  [place] MAIN [facility] SERVICES [address] [place], [place], [address] Phone: [phone]   Fax:  [phone]  Name: Jessica Moody MRN: [id number] Date of Birth: 06/14/1965

## 2016-12-11 ENCOUNTER — Ambulatory Visit: Payer: PRIVATE HEALTH INSURANCE | Admitting: Physical Therapy

## 2016-12-11 DIAGNOSIS — R279 Unspecified lack of coordination: Secondary | ICD-10-CM

## 2016-12-11 DIAGNOSIS — M6281 Muscle weakness (generalized): Secondary | ICD-10-CM

## 2016-12-11 NOTE — Therapy (Signed)
[place] MAIN [facility] SERVICES [address] [place], [place], [address] Phone: [phone]   Fax:  [phone]  Physical Therapy Treatment  Patient Details  Name: Jessica Moody MRN: [id number] Date of Birth: 03/13/1965 Referring Provider: Leafy Ro    Encounter Date: 12/11/2016  PT End of Session - 12/11/16 1310    Visit Number  7    Number of Visits  12    Date for PT Re-Evaluation  01/15/17    PT Start Time  0810    PT Stop Time  0905    PT Time Calculation (min)  55 min    Activity Tolerance  Patient tolerated treatment well;No increased pain    Behavior During Therapy  WFL for tasks assessed/performed       Past Medical History:  Diagnosis Date  . Afib ([place])   . Cancer ([place])    colon  . Colon cancer ([place])    2015, 10 inches removed  . Depression   . Headache   . Heart murmur   . Hyperlipemia   . Hypertension   . Insomnia   . PONV (postoperative nausea and vomiting)     Past Surgical History:  Procedure Laterality Date  . APPENDECTOMY    . CESAREAN SECTION N/A    C-Section X 2  . COLON SURGERY    . COLONOSCOPY WITH PROPOFOL N/A 11/30/2014   Performed by Josefine Class, MD at [place]  . GASTRIC BYPASS  2016  . INCISIONAL HERNIA REPAIR Right   . LAPAROSCOPIC DUODENAL SWITCH WTIH BILIARY PANCREATIC DIVERSION N/A 06/30/2014   Performed by [name], MD at [facility]  . TONSILLECTOMY      There were no vitals filed for this visit.  Subjective Assessment - 12/11/16 1259    Subjective  Pt reported her lower abdominal pain has been better since last session.     Pertinent History  Colon CA Hx, 2 C-section, resection of colon,gastric bypass surgery, incisional hernia R LQ, small fibroid and ovarian cyst. Recent Dx of A-fib. Had a fall onto her R hip in August. Occupation:  sitting.           [facility] PT Assessment - 12/11/16 1302      Palpation   Palpation comment  no tenderness at pubic symphysis, alignment  symmetrical  no increased tensions/ tightness R abdominal > L                Pelvic Floor Special Questions - 12/11/16 1301    Pelvic Floor Internal Exam  pt consented verablly without contrainidcations after being explained details to exam     Exam Type  Vaginal    Palpation   tightness/ tenderness  at L obt int/ iliiococcygeus ( decreased post Tx)     Strength  good squeeze, good lift, able to hold agaisnt strong resistance improved expansion B         [facility] Adult PT Treatment/Exercise - 12/11/16 1306      Neuro Re-ed    Neuro Re-ed Details   education on pelvic floor mobility. coordinaiton with functional activities, physiology and anatomy with images       Moist Heat Therapy   Moist Heat Location  Other (comment) perineal 5 min       Manual Therapy   Internal Pelvic Floor  STM at L obt int              PT Education - 12/11/16 1310  Education provided  Yes    Education Details  HEP    Person(s) Educated  Patient    Methods  Explanation;Demonstration;Tactile cues;Verbal cues;Handout    Comprehension  Returned demonstration;Verbalized understanding          PT Long Term Goals - 11/27/16 0818      PT LONG TERM GOAL #1   Title  Pt will report no pulling sensation in R abdominal area with L spinal rotation in order to turn body in functional activities    Time  4    Period  Weeks    Status  Achieved      PT LONG TERM GOAL #2   Title  Pt will demo no pelvic obliquties across two visits in order to promote proper pelvic floor function and return to ADLs    Time  6    Period  Weeks    Status  Achieved      PT LONG TERM GOAL #3   Title  Pt will report improved Stool Consistency from Type 6-7 to 3-5  or decreased frequency from 3-5 x/ day to 1-3 x day in order to participate in community events     Time  12    Period  Weeks    Status  On-going      PT LONG TERM GOAL #4   Title  Pt will decrease her COREFO score from 34 % to < 29 % in order to restore  GI function    Time  12    Period  Weeks    Status  On-going      PT LONG TERM GOAL #5   Title  Pt will report a decreased Colbert score from 50% to <45 % in order to improve QOL    Time  12    Period  Weeks    Status  On-going            Plan - 12/11/16 1311    Clinical Impression Statement  Pt 's pubic symphysis appeared more aligned today which showed good carry over from last sessions' manual Tx. Pt's R abdominal area also was less tight and tender compared to last session. Addressed L pelvic floor mm tensions today with intravaginal manual Tx. Pt demo'd decreased tensions and iincreased pelvic floor ROM with ability to perform a proper contraction.  Pt was provided education on body mechanics, anatomy/ physiology of pelvic floor in functional activities and maintaining hydration. Pt voiced understanding. Pt is f/u with her GI doctor regarding her positive results for SIBO. PT recommended pt to direct her questions about taking natural remedies for SIBO with her medical provider and was explained that the topics of those questions are outside the scope of PT. Pt voiced understanding.  PT continues to benefit from skilled PT     Rehab Potential  Good    PT Frequency  1x / week    PT Duration  12 weeks    PT Treatment/Interventions  ADLs/Self Care Home Management;Manual techniques;Therapeutic activities;Therapeutic exercise;Neuromuscular re-education;Moist Heat;Traction;Stair training;Functional mobility training;Gait training;Scar mobilization    Consulted and Agree with Plan of Care  Patient       Patient will benefit from skilled therapeutic intervention in order to improve the following deficits and impairments:  Pain, Postural dysfunction, Decreased strength, Decreased safety awareness, Decreased coordination, Decreased activity tolerance, Improper body mechanics, Difficulty walking, Decreased skin integrity, Decreased mobility, Decreased endurance, Decreased balance, Decreased range  of motion, Hypomobility, Increased muscle spasms  Visit Diagnosis: Muscle  weakness (generalized)  Unspecified lack of coordination     Problem List Patient Active Problem List   Diagnosis Date Noted  . 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 HLD (hyperlipidemia) 05/12/2015  . Morbid obesity due to excess calories ([place]) 06/30/2014  . Essential (primary) hypertension 04/03/2014  . Beat, premature ventricular 03/05/2014  . Major depressive disorder, single episode, moderate ([place]) 01/22/2014  . 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 Malignant neoplasm of sigmoid colon ([place]) 11/04/2013  . Other specified abnormal immunological findings in serum 10/30/2013  . 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 Adult BMI 30+ 10/24/2013    [name] ,PT, DPT, E-RYT  12/11/2016, 1:21 PM  [place] MAIN [facility] SERVICES [address] [place], [place], [address] Phone: [phone]   Fax:  [phone]  Name: Jessica Moody MRN: [id number] Date of Birth: 1965-05-28

## 2016-12-18 ENCOUNTER — Encounter: Payer: Self-pay | Admitting: Cardiology

## 2016-12-19 ENCOUNTER — Ambulatory Visit: Payer: PRIVATE HEALTH INSURANCE | Admitting: Cardiology

## 2016-12-25 ENCOUNTER — Encounter: Payer: Managed Care, Other (non HMO) | Admitting: Physical Therapy

## 2016-12-28 ENCOUNTER — Ambulatory Visit: Payer: PRIVATE HEALTH INSURANCE | Attending: Obstetrics and Gynecology | Admitting: Physical Therapy

## 2016-12-28 DIAGNOSIS — R279 Unspecified lack of coordination: Secondary | ICD-10-CM | POA: Diagnosis present

## 2016-12-28 DIAGNOSIS — M6281 Muscle weakness (generalized): Secondary | ICD-10-CM | POA: Diagnosis not present

## 2016-12-28 DIAGNOSIS — M791 Myalgia, unspecified site: Secondary | ICD-10-CM | POA: Diagnosis not present

## 2016-12-28 NOTE — Therapy (Signed)
[place] MAIN [facility] SERVICES [address] [place], [place], [address] Phone: [phone]   Fax:  [phone]  Physical Therapy Treatment  Patient Details  Name: Jessica Moody MRN: [id number] Date of Birth: [birth year] Referring Provider: Leafy Ro    Encounter Date: 12/28/2016  PT End of Session - 12/28/16 0855    Visit Number  8    Number of Visits  12    Date for PT Re-Evaluation  01/15/17    PT Start Time  0810    PT Stop Time  2694    PT Time Calculation (min)  45 min    Activity Tolerance  Patient tolerated treatment well;No increased pain    Behavior During Therapy  WFL for tasks assessed/performed       Past Medical History:  Diagnosis Date  . Afib ([place])   . Cancer ([place])    colon  . Colon cancer ([place])    2015, 10 inches removed  . Depression   . Headache   . Heart murmur   . Hyperlipemia   . Hypertension   . Insomnia   . PONV (postoperative nausea and vomiting)     Past Surgical History:  Procedure Laterality Date  . APPENDECTOMY    . CESAREAN SECTION N/A    C-Section X 2  . COLON SURGERY    . COLONOSCOPY WITH PROPOFOL N/A 11/30/2014   Procedure: COLONOSCOPY WITH PROPOFOL;  Surgeon: Josefine Class, MD;  Location: [facility]COPY;  Service: Endoscopy;  Laterality: N/A;  . GASTRIC BYPASS  2016  . INCISIONAL HERNIA REPAIR Right   . LAPAROSCOPIC GASTRIC RESTRICTIVE DUODENAL PROCEDURE (DUODENAL SWITCH) N/A 06/30/2014   Procedure: LAPAROSCOPIC DUODENAL SWITCH WTIH BILIARY PANCREATIC DIVERSION ;  Surgeon: [name], MD;  Location: [facility] ORS;  Service: General;  Laterality: N/A;  . TONSILLECTOMY      There were no vitals filed for this visit.  Subjective Assessment - 12/28/16 0814    Subjective  Pt was able to manage her hip pain that increased with carriding. Pt was able to have have sexual intercourse with 90% less pain. Pt reports she has been taking antibiotics and she thinks she may have a yeast infection. Pt  plans to contact her gynecologist about this issue.     Pertinent History  Colon CA Hx, 2 C-section, resection of colon,gastric bypass surgery, incisional hernia R LQ, small fibroid and ovarian cyst. Recent Dx of A-fib. Had a fall onto her R hip in August. Occupation:  sitting.           [facility] PT Assessment - 12/28/16 0830      Strength   Overall Strength  -- glut ext 5/5. hip ext 3+/5 B        Limited hip ext on R >L  Sitting with R leg crossed. Able to self correct after cues           [facility] Adult PT Treatment/Exercise - 12/28/16 0845      Exercises   Exercises  -- see pt instructions                  PT Long Term Goals - 12/28/16 0816      PT LONG TERM GOAL #1   Title  Pt will report no pulling sensation in R abdominal area with L spinal rotation in order to turn body in functional activities    Time  4    Period  Weeks    Status  Achieved      PT LONG TERM GOAL #2   Title  Pt will demo no pelvic obliquties across two visits in order to promote proper pelvic floor function and return to ADLs    Time  6    Period  Weeks    Status  Achieved      PT LONG TERM GOAL #3   Title  Pt will report improved Stool Consistency from Type 6-7 to 3-5  or decreased frequency from 3-5 x/ day to 1-3 x day in order to participate in community events     Time  12    Period  Weeks    Status  Achieved      PT LONG TERM GOAL #4   Title  Pt will decrease her COREFO score from 34 % to < 29 % in order to restore GI function ( 12/6: 22%)    Time  12    Period  Weeks    Status  Achieved      PT LONG TERM GOAL #5   Title  Pt will report a decreased Sidney score from 50% to <45 % in order to improve QOL  ( 12/6: 0%)     Time  12    Period  Weeks    Status  Achieved      Additional Long Term Goals   Additional Long Term Goals  Yes      PT LONG TERM GOAL #6   Title  Pt will demo proper alignment and co-activation of deep core in fitness exercises and use of low weights in  order to return to fitness    Time  3    Period  Weeks    Status  New    Target Date  01/18/17            Plan - 12/28/16 0855    Clinical Impression Statement  Pt is progressing well towards her goals with signficantly decreased scores on both questionnaires. Progressed pt to upright strengthening exercises to increase hip abduction strengthening and thoracolumbar/posterior chain system. Plan to educate pt on use of low weights and fitness exercises at next session before d/c. Pt continues to benefit from skilled PT.     Rehab Potential  Good    PT Frequency  1x / week    PT Duration  12 weeks    PT Treatment/Interventions  ADLs/Self Care Home Management;Manual techniques;Therapeutic activities;Therapeutic exercise;Neuromuscular re-education;Moist Heat;Traction;Stair training;Functional mobility training;Gait training;Scar mobilization    Consulted and Agree with Plan of Care  Patient       Patient will benefit from skilled therapeutic intervention in order to improve the following deficits and impairments:  Pain, Postural dysfunction, Decreased strength, Decreased safety awareness, Decreased coordination, Decreased activity tolerance, Improper body mechanics, Difficulty walking, Decreased skin integrity, Decreased mobility, Decreased endurance, Decreased balance, Decreased range of motion, Hypomobility, Increased muscle spasms  Visit Diagnosis: Muscle weakness (generalized)  Unspecified lack of coordination     Problem List Patient Active Problem List   Diagnosis Date Noted  . 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 HLD (hyperlipidemia) 05/12/2015  . Morbid obesity due to excess calories ([place]) 06/30/2014  . Essential (primary) hypertension 04/03/2014  . Beat, premature ventricular 03/05/2014  . Major depressive disorder, single episode, moderate ([place]) 01/22/2014  . 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 Malignant neoplasm of sigmoid colon ([place]) 11/04/2013  .  Other specified abnormal immunological findings in serum 10/30/2013  . 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 Adult BMI 30+ 10/24/2013    [name]  ,PT, DPT, E-RYT  12/28/2016, 9:06 AM  [place] MAIN [facility] SERVICES [address] [place], [place], [address] Phone: [phone]   Fax:  [phone]  Name: Jessica Moody MRN: [id number] Date of Birth: 03-29-1965

## 2016-12-28 NOTE — Patient Instructions (Addendum)
Side stepping and mini squat  With red band at thigh  And the pulling out with the arm band ( elbows by the side)  Left and right down hallway 10 ft 2 laps each direction   followed by figure 4 stretch    _____   Blue band at the door way  Walking 3-4 steps forward and slowly backwards for deep core control and lower extremity  Activation 10 reps   _____  Jessica Moody  Laying down on belly  Upper arm in "V" orientation on R  L leg up  10 wobbles of the leg  While keeping the R arm up and neck lift, chin and gaze down   5 reps each side    ___________   Progress the "puling the sword" to standing and blue band Knees slightly bent  10 reps    ___________

## 2017-01-01 ENCOUNTER — Other Ambulatory Visit: Payer: PRIVATE HEALTH INSURANCE

## 2017-01-01 ENCOUNTER — Ambulatory Visit: Payer: PRIVATE HEALTH INSURANCE | Admitting: Cardiology

## 2017-01-01 DIAGNOSIS — Z1379 Encounter for other screening for genetic and chromosomal anomalies: Secondary | ICD-10-CM | POA: Insufficient documentation

## 2017-01-04 ENCOUNTER — Ambulatory Visit: Payer: PRIVATE HEALTH INSURANCE | Admitting: Physical Therapy

## 2017-01-05 ENCOUNTER — Encounter: Payer: Self-pay | Admitting: *Deleted

## 2017-01-05 ENCOUNTER — Encounter: Payer: Self-pay | Admitting: Cardiology

## 2017-01-05 ENCOUNTER — Ambulatory Visit: Payer: PRIVATE HEALTH INSURANCE | Admitting: Cardiology

## 2017-01-05 VITALS — BP 124/60 | HR 77 | Ht 63.5 in | Wt 177.0 lb

## 2017-01-05 DIAGNOSIS — I5022 Chronic systolic (congestive) heart failure: Secondary | ICD-10-CM | POA: Diagnosis not present

## 2017-01-05 DIAGNOSIS — C19 Malignant neoplasm of rectosigmoid junction: Secondary | ICD-10-CM | POA: Insufficient documentation

## 2017-01-05 DIAGNOSIS — R079 Chest pain, unspecified: Secondary | ICD-10-CM | POA: Diagnosis not present

## 2017-01-05 DIAGNOSIS — N950 Postmenopausal bleeding: Secondary | ICD-10-CM | POA: Insufficient documentation

## 2017-01-05 DIAGNOSIS — I10 Essential (primary) hypertension: Secondary | ICD-10-CM

## 2017-01-05 DIAGNOSIS — S92309A Fracture of unspecified metatarsal bone(s), unspecified foot, initial encounter for closed fracture: Secondary | ICD-10-CM | POA: Insufficient documentation

## 2017-01-05 DIAGNOSIS — I480 Paroxysmal atrial fibrillation: Secondary | ICD-10-CM

## 2017-01-05 NOTE — Patient Instructions (Signed)
Medication Instructions:  Your physician recommends that you continue on your current medications as directed. Please refer to the Current Medication list given to you today.  * If you need a refill on your cardiac medications before your next appointment, please call your pharmacy. *  Labwork: None ordered  Testing/Procedures: None ordered  Follow-Up: Your physician wants you to follow-up in: 6 months with Dr. Curt Bears.  You will receive a reminder letter in the mail two months in advance. If you don't receive a letter, please call our office to schedule the follow-up appointment.  Thank you for choosing [facility]!!   Trinidad Curet, RN [phone]

## 2017-01-05 NOTE — Progress Notes (Signed)
Electrophysiology Office Note   Date:  01/05/2017   ID:  Jessica, Moody 1965/12/23, MRN [id number]  PCP:  Jessica Ruths, MD  Cardiologist:   Primary Electrophysiologist:  Jessica Cueva Meredith Leeds, MD    Chief Complaint  Patient presents with  . Atrial Fibrillation     History of Present Illness: Jessica Moody is a 51 y.o. female who is being seen today for the evaluation of atrial fibrillaiton at the request of Jessica Ruths, MD. She has a history of colon cancer, hyperlipidemia, and hypertension. Presents today as follow up of AF and new onset mildly decreased LVEF.  She woke from sleep with palpitations and was found to be in AF. Had fatigue and SOB. Went to PCP and was found to be in AF. Converted 2 days later. TTE showed EF 40-45%. Myoview was low risk.  Today, denies symptoms of shortness of breath, orthopnea, PND, lower extremity edema, claudication, dizziness, presyncope, syncope, bleeding, or neurologic sequela. The patient is tolerating medications without difficulties.  She has had one episode of atrial fibrillation since being seen.  She felt weak and fatigued.  She popped back into rhythm on her own.  She also has occasional chest pressure that happens in the middle of her chest.  The chest pressure lasts a few hours.  It occurs at times when she is at work.  Past Medical History:  Diagnosis Date  . Afib ([place])   . Cancer ([place])    colon  . Colon cancer ([place])    2015, 10 inches removed  . Depression   . Headache   . Heart murmur   . Hyperlipemia   . Hypertension   . Insomnia   . PONV (postoperative nausea and vomiting)    Past Surgical History:  Procedure Laterality Date  . APPENDECTOMY    . CESAREAN SECTION N/A    C-Section X 2  . COLON SURGERY    . COLONOSCOPY WITH PROPOFOL N/A 11/30/2014   Procedure: COLONOSCOPY WITH PROPOFOL;  Surgeon: Jessica Class, MD;  Location: [facility] ENDOSCOPY;  Service: Endoscopy;  Laterality: N/A;  . GASTRIC BYPASS   2016  . INCISIONAL HERNIA REPAIR Right   . LAPAROSCOPIC GASTRIC RESTRICTIVE DUODENAL PROCEDURE (DUODENAL SWITCH) N/A 06/30/2014   Procedure: LAPAROSCOPIC DUODENAL SWITCH WTIH BILIARY PANCREATIC DIVERSION ;  Surgeon: Jessica Daniel, MD;  Location: [facility] ORS;  Service: General;  Laterality: N/A;  . TONSILLECTOMY       Current Outpatient Medications  Medication Sig Dispense Refill  . Ascorbic Acid (VITAMIN C) 1000 MG tablet Take 1,000 mg by mouth daily.    . Calcium-Magnesium-Vitamin D (CALCIUM 1200+D3 PO) Take 1 capsule by mouth daily.    . cholestyramine light 4 g POWD Take 2 (two) times daily by mouth.    . losartan (COZAAR) 25 MG tablet Take 1 tablet (25 mg total) by mouth daily. 90 tablet 3  . metoprolol succinate (TOPROL-XL) 50 MG 24 hr tablet Take 50 mg by mouth daily.    . Multiple Vitamin (MULTIVITAMIN) tablet Take 1 tablet by mouth daily.    . rivaroxaban (XARELTO) 20 MG TABS tablet Take 1 tablet (20 mg total) by mouth daily with supper. 30 tablet 6   No current facility-administered medications for this visit.     Allergies:   Oxycodone; Poison ivy extract; Aloe vera; Diclofenac sodium; Nsaids; and Prednisone   Social History:  The patient  reports that she quit smoking about 18 years ago. Her smoking use included  cigarettes. She smoked 1.00 pack per day. she has never used smokeless tobacco. She reports that she does not drink alcohol or use drugs.   Family History:  The patient's family history includes Diabetes type II in her mother; Heart failure in her mother; Hypertension in her mother; Kidney disease in her mother.   ROS:  Please see the history of present illness.   Otherwise, review of systems is positive for chest pressure, palpitations, dyspnea on exertion, abdominal pain, diarrhea, back pain, rash.   All other systems are reviewed and negative.   PHYSICAL EXAM: VS:  BP 124/60   Pulse 77   Ht 5' 3.5" (1.613 m)   Wt 177 lb (80.3 kg)   LMP 09/29/2015   SpO2 98%    BMI 30.86 kg/m  , BMI Body mass index is 30.86 kg/m. GEN: Well nourished, well developed, in no acute distress  HEENT: normal  Neck: no JVD, carotid bruits, or masses Cardiac: RRR; 2 out of 6 systolic murmur at the base, no rubs, or gallops,no edema  Respiratory:  clear to auscultation bilaterally, normal work of breathing GI: soft, nontender, nondistended, + BS MS: no deformity or atrophy  Skin: warm and dry Neuro:  Strength and sensation are intact Psych: euthymic mood, full affect  EKG:  EKG is not ordered today. Personal review of the ekg ordered 09/12/16 shows SR, rate 56   Recent Labs: 10/13/2016: BUN 11; Creatinine, Ser 0.57; Potassium 3.8; Sodium 143    Lipid Panel  No results found for: CHOL, TRIG, HDL, CHOLHDL, VLDL, LDLCALC, LDLDIRECT   Wt Readings from Last 3 Encounters:  01/05/17 177 lb (80.3 kg)  10/04/16 165 lb 12.8 oz (75.2 kg)  10/03/16 168 lb (76.2 kg)      Other studies Reviewed: Additional studies/ records that were reviewed today include: TTE 09/20/16 - Left ventricle: The cavity size was mildly dilated. Wall   thickness was normal. Systolic function was mildly to moderately   reduced. The estimated ejection fraction was in the range of 40%   to 45%. Diffuse hypokinesis. - Aortic valve: There was mild regurgitation. - Right ventricle: The cavity size was mildly dilated. Wall   thickness was normal.  Myoview 10/03/16  The left ventricular ejection fraction is mildly decreased (45-54%).  Nuclear stress EF: 47%.  There was no ST segment deviation noted during stress.  Defect 1: There is a small defect of moderate severity present in the apex location.  This is a low risk study.   Low risk stress nuclear study with very mild apical ischemia; EF 47 with global hypokinesis and mild LVE.   ASSESSMENT AND PLAN:  1.   Paroxysmal atrial fibrillation: Currently on Toprol-XL and Xarelto.  Tolerating medications well.  Has had one episode of atrial  fibrillation since being seen.  No changes.  This patients CHA2DS2-VASc Score and unadjusted Ischemic Stroke Rate (% per year) is equal to 2.2 % stroke rate/year from a score of 2  Above score calculated as 1 point each if present [CHF, HTN, DM, Vascular=MI/PAD/Aortic Plaque, Age if 65-74, or Female] Above score calculated as 2 points each if present [Age > 75, or Stroke/TIA/TE]  2. Hypertension: Blood pressure issues resolved after gastric bypass  3. Systolic heart failure, chronicity unknown: Ejection fraction 40-45%.  Myoview showed an area of ischemia in the apex.  If her chest pain does continue and is not relieved with antacids, may benefit from heart catheterization to fully define the coronary anatomy.  4.  Chest pain: Appears to be atypical.  I have asked her to try antacids to see if this Jessica Moody help.  Should she continue to have chest pain, coronary angiography may be beneficial to further define coronary anatomy.  Current medicines are reviewed at length with the patient today.   The patient does not have concerns regarding her medicines.  The following changes were made today:  none  Labs/ tests ordered today include:  No orders of the defined types were placed in this encounter.  Disposition:   FU with Louann Hopson 6 months  Signed, Geniyah Eischeid Meredith Leeds, MD  01/05/2017 3:47 PM     [address] [place] [address] [phone] (office) [phone] (fax)

## 2017-01-11 ENCOUNTER — Telehealth: Payer: Self-pay | Admitting: *Deleted

## 2017-01-11 ENCOUNTER — Ambulatory Visit: Payer: PRIVATE HEALTH INSURANCE | Admitting: Physical Therapy

## 2017-01-11 DIAGNOSIS — M6281 Muscle weakness (generalized): Secondary | ICD-10-CM | POA: Diagnosis not present

## 2017-01-11 DIAGNOSIS — R279 Unspecified lack of coordination: Secondary | ICD-10-CM

## 2017-01-11 NOTE — Telephone Encounter (Signed)
Dr Curt Bears- by mistake I had sent you this on the wrong pt. It's Ms Croft that needs the clearance. You just saw her in the office. So sorry for the confusion-  Dr Curt Bears, this pt needs to have a colonoscopy.  There are two issues, one is holding the Xarelto, which I think is OK. The  Other is the issue of this vague "cramping" chest pain. Her Myoview was not entirely normal and you mentioned possibly proceeding to cath to define her anatomy. She has not had chest pain since she saw you last. Are you OK with holding her Xarelto and proceeding with colonoscopy without further work up?  Kerin Ransom PA-C 01/11/2017 4:33 PM

## 2017-01-11 NOTE — Patient Instructions (Addendum)
Fitness strengthening   One sided pushup  20 reps each side  X 3 sets throughout the day   Single heel raises 10 reps x 3 throughout the day  ______  Aerobic   standingmarching without moving pelvis and knee to midline , twist trunk only  30 sec x 3 with stretches in between    2x sets /day ______ Progression for deep core with more low belly strengthening  Breathing Deep core 1  Modified Deep core 4 ( sliding heel with all four corners of the feet and tracking knee along hip line)  10reps each leg x 3 sets    _____

## 2017-01-11 NOTE — Telephone Encounter (Signed)
I called and tried to leave a message but "mailbox is full".   Kerin Ransom PA-C 01/11/2017 4:08 PM

## 2017-01-11 NOTE — Telephone Encounter (Addendum)
   [facility] Pre-operative Risk Assessment    Request for surgical clearance: [facility]  1. What type of surgery is being performed? colonoscopy   2. When is this surgery scheduled? 01-25-2017   3. Are there any medications that need to be held prior to surgery and how long? Xarelto   4. Practice name and name of physician performing surgery? Dr. Dorise Bullion   5. What is your office phone and fax number? Phone: [phone]  Fax. [phone]   6. Anesthesia type (None, local, MAC, general) ? Moderate Anesthesia    Jessica Moody M 01/11/2017, 3:33 PM  _________________________________________________________________   (provider comments below)

## 2017-01-11 NOTE — Telephone Encounter (Signed)
I spoke with pt- I have asked Dr Curt Bears to review.  Kerin Ransom PA-C 01/11/2017 4:44 PM

## 2017-01-12 ENCOUNTER — Ambulatory Visit: Payer: PRIVATE HEALTH INSURANCE

## 2017-01-12 NOTE — Therapy (Deleted)
[place] MAIN [facility] SERVICES [address] [place], [place], [address] Phone: [phone]   Fax:  [phone]  Physical Therapy Evaluation  Patient Details  Name: Jessica Moody MRN: [id number] Date of Birth: 23-Apr-1965 Referring Provider: Leafy Moody    Encounter Date: 01/11/2017  PT End of Session - 01/12/17 2252    Visit Number  9    Date for PT Re-Evaluation  04/05/17    PT Start Time  0802    PT Stop Time  0900    PT Time Calculation (min)  58 min    Activity Tolerance  Patient tolerated treatment well;No increased pain    Behavior During Therapy  WFL for tasks assessed/performed       Past Medical History:  Diagnosis Date  . Afib ([place])   . Cancer ([place])    colon  . Colon cancer ([place])    2015, 10 inches removed  . Depression   . Headache   . Heart murmur   . Hyperlipemia   . Hypertension   . Insomnia   . PONV (postoperative nausea and vomiting)     Past Surgical History:  Procedure Laterality Date  . APPENDECTOMY    . CESAREAN SECTION N/A    C-Section X 2  . COLON SURGERY    . COLONOSCOPY WITH PROPOFOL N/A 11/30/2014   Procedure: COLONOSCOPY WITH PROPOFOL;  Surgeon: Josefine Class, MD;  Location: [facility] ENDOSCOPY;  Service: Endoscopy;  Laterality: N/A;  . GASTRIC BYPASS  2016  . INCISIONAL HERNIA REPAIR Right   . LAPAROSCOPIC GASTRIC RESTRICTIVE DUODENAL PROCEDURE (DUODENAL SWITCH) N/A 06/30/2014   Procedure: LAPAROSCOPIC DUODENAL SWITCH WTIH BILIARY PANCREATIC DIVERSION ;  Surgeon: [name], MD;  Location: [facility] ORS;  Service: General;  Laterality: N/A;  . TONSILLECTOMY      There were no vitals filed for this visit.       [facility] PT Assessment - 01/12/17 2251      Coordination   Gross Motor Movements are Fluid and Coordinated  -- poor foot activation, limited weightbearing on 1st metatarsa             Objective measurements completed on examination: See above findings.    Pelvic Floor Special  Questions - 01/12/17 2251    Pelvic Floor Internal Exam  pt consented verablly without contrainidcations after being explained details to exam     Exam Type  Vaginal    Palpation   tightness/ tenderness  at R obt int > L     Strength  good squeeze, good lift, able to hold agaisnt strong resistance       [facility] Adult PT Treatment/Exercise - 01/12/17 2252      Neuro Re-ed    Neuro Re-ed Details   cued for increased metatarsal / abduction activation with deep core exercises and standing heel raises. Cued for knee alignment with exercises to correct for genu valgus       Exercises   Exercises  -- see pt instructions      Manual Therapy   Internal Pelvic Floor  STM at obt int                    PT Long Term Goals - 01/12/17 2252      PT LONG TERM GOAL #1   Title  Pt will report no pulling sensation in R abdominal area with L spinal rotation in order to turn body in functional activities    Time  4    Period  Weeks    Status  Achieved      PT LONG TERM GOAL #2   Title  Pt will demo no pelvic obliquties across two visits in order to promote proper pelvic floor function and return to ADLs    Time  6    Period  Weeks    Status  Achieved      PT LONG TERM GOAL #3   Title  Pt will report improved Stool Consistency from Type 6-7 to 3-5  or decreased frequency from 3-5 x/ day to 1-3 x day in order to participate in community events     Time  12    Period  Weeks    Status  Achieved      PT LONG TERM GOAL #4   Title  Pt will decrease her COREFO score from 34 % to < 29 % in order to restore GI function ( 12/6: 22%)    Time  12    Period  Weeks    Status  Achieved      PT LONG TERM GOAL #5   Title  Pt will report a decreased [place] score from 50% to <45 % in order to improve QOL  ( 12/6: 0%)     Time  12    Period  Weeks    Status  Achieved      PT LONG TERM GOAL #6   Title  Pt will demo proper alignment and co-activation of deep core in fitness exercises and use of low  weights in order to return to fitness    Time  3    Period  Weeks    Status  On-going             Plan - 01/12/17 2253    Clinical Impression Statement  Pt has achieved 5/6 goals with signficant decreased pelvic, low back, hip pain and improved stool consistency.      Rehab Potential  Good    PT Frequency  1x / week    PT Duration  12 weeks    PT Treatment/Interventions  ADLs/Self Care Home Management;Manual techniques;Therapeutic activities;Therapeutic exercise;Neuromuscular re-education;Moist Heat;Traction;Stair training;Functional mobility training;Gait training;Scar mobilization    Consulted and Agree with Plan of Care  Patient       Patient will benefit from skilled therapeutic intervention in order to improve the following deficits and impairments:  Pain, Postural dysfunction, Decreased strength, Decreased safety awareness, Decreased coordination, Decreased activity tolerance, Improper body mechanics, Difficulty walking, Decreased skin integrity, Decreased mobility, Decreased endurance, Decreased balance, Decreased range of motion, Hypomobility, Increased muscle spasms  Visit Diagnosis: Muscle weakness (generalized)  Unspecified lack of coordination     Problem List Patient Active Problem List   Diagnosis Date Noted  . Closed fracture of metatarsal bone 01/05/2017  . PMB (postmenopausal bleeding) 01/05/2017  . Colorectal cancer ([place]) 01/05/2017  . 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 HLD (hyperlipidemia) 05/12/2015  . H/O bariatric surgery 09/02/2014  . Morbid obesity due to excess calories ([place]) 06/30/2014  . Essential (primary) hypertension 04/03/2014  . Beat, premature ventricular 03/05/2014  . 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 Major depressive disorder, single episode, moderate ([place]) 01/22/2014  . 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 Malignant neoplasm of sigmoid colon ([place]) 11/04/2013  . Other specified abnormal immunological findings in serum  10/30/2013  . Red blood cell antibody positive 10/30/2013  . 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 Adult BMI 30+ 10/24/2013  [name] 01/12/2017, 10:55 PM  [place] MAIN [facility] SERVICES [address] [place], [place], [address] Phone: [phone]   Fax:  [phone]  Name: Jessica Moody MRN: [id number] Date of Birth: 01-25-1965

## 2017-01-12 NOTE — Therapy (Signed)
[place] MAIN [facility] SERVICES [address] [place], [place], [address] Phone: [phone]   Fax:  [phone]  Physical Therapy Treatment [place] [facility] Note  Patient Details  Name: DALY WHIPKEY MRN: [id number] Date of Birth: 11-22-1965 Referring Provider: Leafy Ro    Encounter Date: 01/11/2017  PT End of Session - 01/12/17 2252    Visit Number  9    Date for PT Re-Evaluation  04/05/17    PT Start Time  0802    PT Stop Time  0900    PT Time Calculation (min)  58 min    Activity Tolerance  Patient tolerated treatment well;No increased pain    Behavior During Therapy  WFL for tasks assessed/performed       Past Medical History:  Diagnosis Date  . Afib ([place])   . Cancer ([place])    colon  . Colon cancer ([place])    2015, 10 inches removed  . Depression   . Headache   . Heart murmur   . Hyperlipemia   . Hypertension   . Insomnia   . PONV (postoperative nausea and vomiting)     Past Surgical History:  Procedure Laterality Date  . APPENDECTOMY    . CESAREAN SECTION N/A    C-Section X 2  . COLON SURGERY    . COLONOSCOPY WITH PROPOFOL N/A 11/30/2014   Procedure: COLONOSCOPY WITH PROPOFOL;  Surgeon: Josefine Class, MD;  Location: [facility] ENDOSCOPY;  Service: Endoscopy;  Laterality: N/A;  . GASTRIC BYPASS  2016  . INCISIONAL HERNIA REPAIR Right   . LAPAROSCOPIC GASTRIC RESTRICTIVE DUODENAL PROCEDURE (DUODENAL SWITCH) N/A 06/30/2014   Procedure: LAPAROSCOPIC DUODENAL SWITCH WTIH BILIARY PANCREATIC DIVERSION ;  Surgeon: [name], MD;  Location: [facility] ORS;  Service: General;  Laterality: N/A;  . TONSILLECTOMY      There were no vitals filed for this visit.  Subjective Assessment - 01/12/17 2311    Subjective  Pt has not had low back pain and hip pain for 1-2 months. Pt is being treated for SIBO by her GI doctor.     Pertinent History  Colon CA Hx, 2 C-section, resection of colon,gastric bypass surgery, incisional hernia R LQ, small fibroid  and ovarian cyst. Recent Dx of A-fib. Had a fall onto her R hip in August. Occupation:  sitting.           [facility] PT Assessment - 01/12/17 2251      Coordination   Gross Motor Movements are Fluid and Coordinated  -- poor foot activation, limited weightbearing on 1st metatarsa               Pelvic Floor Special Questions - 01/12/17 2251    Pelvic Floor Internal Exam  pt consented verablly without contrainidcations after being explained details to exam     Exam Type  Vaginal    Palpation   tightness/ tenderness  at R obt int > L     Strength  good squeeze, good lift, able to hold agaisnt strong resistance        [facility] Adult PT Treatment/Exercise - 01/12/17 2252      Neuro Re-ed    Neuro Re-ed Details   cued for increased metatarsal / abduction activation with deep core exercises and standing heel raises. Cued for knee alignment with exercises to correct for genu valgus       Exercises   Exercises  -- see pt instructions      Manual Therapy   Internal  Pelvic Floor  STM at obt int                    PT Long Term Goals - 01/12/17 2252      PT LONG TERM GOAL #1   Title  Pt will report no pulling sensation in R abdominal area with L spinal rotation in order to turn body in functional activities    Time  4    Period  Weeks    Status  Achieved      PT LONG TERM GOAL #2   Title  Pt will demo no pelvic obliquties across two visits in order to promote proper pelvic floor function and return to ADLs    Time  6    Period  Weeks    Status  Achieved      PT LONG TERM GOAL #3   Title  Pt will report improved Stool Consistency from Type 6-7 to 3-5  or decreased frequency from 3-5 x/ day to 1-3 x day in order to participate in community events     Time  12    Period  Weeks    Status  Achieved      PT LONG TERM GOAL #4   Title  Pt will decrease her COREFO score from 34 % to < 29 % in order to restore GI function ( 12/6: 22%)    Time  12    Period  Weeks     Status  Achieved      PT LONG TERM GOAL #5   Title  Pt will report a decreased [place] score from 50% to <45 % in order to improve QOL  ( 12/6: 0%)     Time  12    Period  Weeks    Status  Achieved      PT LONG TERM GOAL #6   Title  Pt will demo proper alignment and co-activation of deep core in fitness exercises and use of low weights in order to return to fitness    Time  3    Period  Weeks    Status  On-going            Plan - 01/12/17 2253    Clinical Impression Statement  Pt has achieved 5/6 goals with signficant decreased pelvic, low back, hip pain and improved stool consistency. Pt has shown signficantly decreased abdominal scar restrictions and pelvic floor mm tensions.  Pt is progressing towards overall strengthening and preparation for fitness activities. Pt continues to benefit from skilled PT.     Rehab Potential  Good    PT Frequency  1x / week    PT Duration  12 weeks    PT Treatment/Interventions  ADLs/Self Care Home Management;Manual techniques;Therapeutic activities;Therapeutic exercise;Neuromuscular re-education;Moist Heat;Traction;Stair training;Functional mobility training;Gait training;Scar mobilization    Consulted and Agree with Plan of Care  Patient       Patient will benefit from skilled therapeutic intervention in order to improve the following deficits and impairments:  Pain, Postural dysfunction, Decreased strength, Decreased safety awareness, Decreased coordination, Decreased activity tolerance, Improper body mechanics, Difficulty walking, Decreased skin integrity, Decreased mobility, Decreased endurance, Decreased balance, Decreased range of motion, Hypomobility, Increased muscle spasms  Visit Diagnosis: Muscle weakness (generalized)  Unspecified lack of coordination     Problem List Patient Active Problem List   Diagnosis Date Noted  . Closed fracture of metatarsal bone 01/05/2017  . PMB (postmenopausal bleeding) 01/05/2017  . Colorectal cancer  ([place])  01/05/2017  . Malignant neoplasm of large intestine ([place]) 05/12/2015  . HLD (hyperlipidemia) 05/12/2015  . H/O bariatric surgery 09/02/2014  . Morbid obesity due to excess calories ([place]) 06/30/2014  . Essential (primary) hypertension 04/03/2014  . Beat, premature ventricular 03/05/2014  . 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 Major depressive disorder, single episode, moderate ([place]) 01/22/2014  . 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 Malignant neoplasm of sigmoid colon ([place]) 11/04/2013  . Other specified abnormal immunological findings in serum 10/30/2013  . Red blood cell antibody positive 10/30/2013  . 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 Adult BMI 30+ 10/24/2013    [name] ,PT, DPT, E-RYT  01/12/2017, 11:11 PM  [place] MAIN [facility] SERVICES [address] [place], [place], [address] Phone: [phone]   Fax:  [phone]  Name: ROCKELLE HEUERMAN MRN: [id number] Date of Birth: 05-Feb-1965

## 2017-01-18 ENCOUNTER — Telehealth: Payer: Self-pay | Admitting: Cardiology

## 2017-01-18 NOTE — Telephone Encounter (Signed)
F/u Message  Misty from [facility] call to f/u on surgical clearance. Please call back to discuss

## 2017-01-18 NOTE — Telephone Encounter (Signed)
Returned the call to Macy at [facility]. She has been made aware that once we hear back from the cardiologist we will contact her about whether or not the patient has been cleared. She verbalized her understanding.

## 2017-01-19 ENCOUNTER — Ambulatory Visit (INDEPENDENT_AMBULATORY_CARE_PROVIDER_SITE_OTHER): Payer: Self-pay | Admitting: Podiatry

## 2017-01-19 DIAGNOSIS — L603 Nail dystrophy: Secondary | ICD-10-CM

## 2017-01-19 DIAGNOSIS — B351 Tinea unguium: Secondary | ICD-10-CM

## 2017-01-19 NOTE — Telephone Encounter (Signed)
   Pt chart personally reviewed by Dr. Burt Knack DOD. He reviewed recent stress test and feels that pt can be cleared for colonoscopy w/o further cardiac w/u. Pharmacy to address Xarelto.

## 2017-01-19 NOTE — Telephone Encounter (Signed)
    routing clearance and anticoagulation recs to Gastroenterologist. Pt has also been notified regarding Xarelto recommendations. Will fax to number listed below.

## 2017-01-19 NOTE — Telephone Encounter (Signed)
Patient with diagnosis of atrial fibrillation on Xarelto for anticoagulation.    Procedure: colonoscopy Date of procedure: 01/25/17  CHADS2-VASc score of  2 (CHF, HTN, AGE, DM2, stroke/tia x 2, CAD, AGE, female)  CrCl 145.8 Platelet count 251  Per office protocol, patient can hold Xarelto for 1 day prior to procedure.   Patient will not need bridging with Lovenox (enoxaparin) around procedure.  Patient should restart Xarelto on the evening of procedure or day after, at discretion of procedure MD

## 2017-01-24 ENCOUNTER — Encounter: Payer: PRIVATE HEALTH INSURANCE | Admitting: Physical Therapy

## 2017-01-25 ENCOUNTER — Encounter: Payer: PRIVATE HEALTH INSURANCE | Admitting: Physical Therapy

## 2017-02-01 NOTE — Progress Notes (Signed)
Pt presents with mycotic infection of nails 1-5 bilateral   All other systems are negative  Laser therapy administered to affected nails and tolerated well. All safety precautions were in place. Re-appointed in 4 weeks for 5th treatment 

## 2017-02-02 ENCOUNTER — Ambulatory Visit: Payer: PRIVATE HEALTH INSURANCE | Admitting: Physical Therapy

## 2017-02-05 ENCOUNTER — Encounter: Payer: Self-pay | Admitting: Physical Therapy

## 2017-02-08 ENCOUNTER — Encounter: Payer: PRIVATE HEALTH INSURANCE | Admitting: Physical Therapy

## 2017-02-13 ENCOUNTER — Ambulatory Visit: Payer: PRIVATE HEALTH INSURANCE | Attending: Obstetrics and Gynecology | Admitting: Physical Therapy

## 2017-02-13 DIAGNOSIS — M6281 Muscle weakness (generalized): Secondary | ICD-10-CM | POA: Diagnosis not present

## 2017-02-13 DIAGNOSIS — R279 Unspecified lack of coordination: Secondary | ICD-10-CM | POA: Diagnosis present

## 2017-02-13 NOTE — Therapy (Signed)
[place] MAIN [facility] SERVICES [address] [place], [place], [address] Phone: [phone]   Fax:  [phone]  Physical Therapy Treatment  Patient Details  Name: Jessica Moody MRN: [id number] Date of Birth: 01-05-1966 Referring Provider: Leafy Ro    Encounter Date: 02/13/2017  PT End of Session - 02/13/17 0821    Visit Number  10    Date for PT Re-Evaluation  04/05/17    PT Start Time  0815    PT Stop Time  7026    PT Time Calculation (min)  43 min    Activity Tolerance  Patient tolerated treatment well;No increased pain    Behavior During Therapy  WFL for tasks assessed/performed       Past Medical History:  Diagnosis Date  . Afib ([place])   . Cancer ([place])    colon  . Colon cancer ([place])    2015, 10 inches removed  . Depression   . Headache   . Heart murmur   . Hyperlipemia   . Hypertension   . Insomnia   . PONV (postoperative nausea and vomiting)     Past Surgical History:  Procedure Laterality Date  . APPENDECTOMY    . CESAREAN SECTION N/A    C-Section X 2  . COLON SURGERY    . COLONOSCOPY WITH PROPOFOL N/A 11/30/2014   Procedure: COLONOSCOPY WITH PROPOFOL;  Surgeon: Josefine Class, MD;  Location: [facility] ENDOSCOPY;  Service: Endoscopy;  Laterality: N/A;  . GASTRIC BYPASS  2016  . INCISIONAL HERNIA REPAIR Right   . LAPAROSCOPIC GASTRIC RESTRICTIVE DUODENAL PROCEDURE (DUODENAL SWITCH) N/A 06/30/2014   Procedure: LAPAROSCOPIC DUODENAL SWITCH WTIH BILIARY PANCREATIC DIVERSION ;  Surgeon: [name], MD;  Location: [facility] ORS;  Service: General;  Laterality: N/A;  . TONSILLECTOMY      There were no vitals filed for this visit.  Subjective Assessment - 02/13/17 0821    Subjective  Pt reported her stomach started bothering her before and after her colonscopy.  The results of her colonoscopy was negative.  Her GI doctor recommended her to follow a low fat fiber diet which seemed to help.      Pertinent History  Colon CA Hx, 2  C-section, resection of colon,gastric bypass surgery, incisional hernia R LQ, small fibroid and ovarian cyst. Recent Dx of A-fib. Had a fall onto her R hip in August. Occupation:  sitting.           [facility] PT Assessment - 02/13/17 0841      Observation/Other Assessments   Observations  poor propioception of feet in SLS exercise. improved following cues       Strength   Overall Strength  -- hip ext B 3/5, hip abd 3/5 B                   [facility] Adult PT Treatment/Exercise - 02/13/17 3785      Neuro Re-ed    Neuro Re-ed Details   cued for activation of hip abd to minimize genu valgus in side steps, mini squat and less rigidity in monster walking, scap stabilizing and feet wider in BOS in swimmers standing version. 3 sets of 2 min swimmers, ( 2 sets without redband at ankle, 1 set with),  10 ft monster walking, 10 reps side stepping              PT Education - 02/13/17 0830    Education provided  Yes    Education Details  HEP  Person(s) Educated  Patient    Methods  Explanation;Demonstration;Tactile cues;Verbal cues;Handout    Comprehension  Returned demonstration;Verbalized understanding          PT Long Term Goals - 02/13/17 9379      PT LONG TERM GOAL #1   Title  Pt will report no pulling sensation in R abdominal area with L spinal rotation in order to turn body in functional activities    Time  4    Period  Weeks    Status  Achieved      PT LONG TERM GOAL #2   Title  Pt will demo no pelvic obliquties across two visits in order to promote proper pelvic floor function and return to ADLs    Time  6    Period  Weeks    Status  Achieved      PT LONG TERM GOAL #3   Title  Pt will report improved Stool Consistency from Type 6-7 to 3-5  or decreased frequency from 3-5 x/ day to 1-3 x day in order to participate in community events     Time  12    Period  Weeks    Status  Achieved      PT LONG TERM GOAL #4   Title  Pt will decrease her COREFO score from  34 % to < 29 % in order to restore GI function ( 12/6: 22%)    Time  12    Period  Weeks    Status  Achieved      PT LONG TERM GOAL #5   Title  Pt will report a decreased [place] score from 50% to <45 % in order to improve QOL  ( 12/6: 0%)     Time  12    Period  Weeks    Status  Achieved      PT LONG TERM GOAL #6   Title  Pt will demo proper alignment and co-activation of deep core in fitness exercises and use of low weights in order to return to fitness    Time  3    Period  Weeks    Status  On-going            Plan - 02/13/17 0901    Clinical Impression Statement  Pt progressed to dynamic balance exercises with glut max and medius/minimus strengthening exercises.  Selected standing glut strengthening exercises and avoided bridges due to pt's cardiac Hx.  Pt required propioception training for feet and knee alignment. Pt tolerated session without complaints.  Pt continues to benefit from skilled PT to increase glut and thoracolumbar strength. Provided pt a nutritional referral to get help addressing her diet given Hx of gastric by pass and CA. Pt's colonoscopy showed negative findings. Pt remains in remission.    Rehab Potential  Good    PT Frequency  1x / week    PT Duration  12 weeks    PT Treatment/Interventions  ADLs/Self Care Home Management;Manual techniques;Therapeutic activities;Therapeutic exercise;Neuromuscular re-education;Moist Heat;Traction;Stair training;Functional mobility training;Gait training;Scar mobilization    Consulted and Agree with Plan of Care  Patient       Patient will benefit from skilled therapeutic intervention in order to improve the following deficits and impairments:  Pain, Postural dysfunction, Decreased strength, Decreased safety awareness, Decreased coordination, Decreased activity tolerance, Improper body mechanics, Difficulty walking, Decreased skin integrity, Decreased mobility, Decreased endurance, Decreased balance, Decreased range of motion,  Hypomobility, Increased muscle spasms  Visit Diagnosis: Muscle weakness (generalized)  Unspecified  lack of coordination     Problem List Patient Active Problem List   Diagnosis Date Noted  . Closed fracture of metatarsal bone 01/05/2017  . PMB (postmenopausal bleeding) 01/05/2017  . Colorectal cancer ([place]) 01/05/2017  . 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 HLD (hyperlipidemia) 05/12/2015  . H/O bariatric surgery 09/02/2014  . Morbid obesity due to excess calories ([place]) 06/30/2014  . Essential (primary) hypertension 04/03/2014  . Beat, premature ventricular 03/05/2014  . 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 Major depressive disorder, single episode, moderate ([place]) 01/22/2014  . 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 Malignant neoplasm of sigmoid colon ([place]) 11/04/2013  . Other specified abnormal immunological findings in serum 10/30/2013  . Red blood cell antibody positive 10/30/2013  . 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 Adult BMI 30+ 10/24/2013    [name] ,PT, DPT, E-RYT  02/13/2017, 9:04 AM  [place] MAIN [facility] SERVICES [address] [place], [place], [address] Phone: [phone]   Fax:  [phone]  Name: ANASTASHIA WESTERFELD MRN: [id number] Date of Birth: [birth year]

## 2017-02-13 NOTE — Patient Instructions (Addendum)
Discussed strategies to ensure healthier eating:  behavorial habits to keep protein in purse ( beef jerky), nutrs   do research of restaurants int eh area where you are running errands    [email or web address] Location  [phone] [address] [place], [place] [address]    3 workouts 2 minute each   Swimmers, with red band at ankle  Place feet hip width apart shoulders down in half "v"   Side step with four corners of foot down and outerthigh engaged to track knee out along line  Of second toe    Penguin walking with a slight mini squat

## 2017-02-19 ENCOUNTER — Ambulatory Visit (INDEPENDENT_AMBULATORY_CARE_PROVIDER_SITE_OTHER): Payer: Self-pay | Admitting: Podiatry

## 2017-02-19 DIAGNOSIS — B351 Tinea unguium: Secondary | ICD-10-CM

## 2017-02-19 DIAGNOSIS — L603 Nail dystrophy: Secondary | ICD-10-CM

## 2017-02-19 NOTE — Progress Notes (Signed)
Pt presents with mycotic infection of nails 1-5 bilateral  All other systems are negative  Laser therapy administered to affected nails and tolerated well. All safety precautions were in place. Re-appointed in 4 weeks for 6th treatment 

## 2017-03-02 ENCOUNTER — Encounter: Payer: PRIVATE HEALTH INSURANCE | Admitting: Physical Therapy

## 2017-03-05 ENCOUNTER — Ambulatory Visit: Payer: PRIVATE HEALTH INSURANCE | Attending: Obstetrics and Gynecology | Admitting: Physical Therapy

## 2017-03-05 DIAGNOSIS — R279 Unspecified lack of coordination: Secondary | ICD-10-CM | POA: Diagnosis present

## 2017-03-05 DIAGNOSIS — M6281 Muscle weakness (generalized): Secondary | ICD-10-CM | POA: Insufficient documentation

## 2017-03-05 NOTE — Patient Instructions (Addendum)
   Nutritional Resources:  [email or web address] [facility] Location  [phone] [address] [place], [place] [address]  [email or web address]   Anabolic/ catabolic metabolism  [email or web address]   [email or web address]   _______  Pelvic tilting when on toileting to relax pelvic floor

## 2017-03-06 NOTE — Therapy (Signed)
[place] MAIN [facility] SERVICES [place], [place], [address] Phone: [phone]   Fax:  [phone]  Physical Therapy Treatment  Patient Details  Name: Jessica Moody MRN: [id number] Date of Birth: [birth year] Referring Provider: Leafy Ro    Encounter Date: 03/05/2017    Past Medical History:  Diagnosis Date  . Afib ([place])   . Cancer ([place])    colon  . Colon cancer ([place])    2015, 10 inches removed  . Depression   . Headache   . Heart murmur   . Hyperlipemia   . Hypertension   . Insomnia   . PONV (postoperative nausea and vomiting)     Past Surgical History:  Procedure Laterality Date  . APPENDECTOMY    . CESAREAN SECTION N/A    C-Section X 2  . COLON SURGERY    . COLONOSCOPY WITH PROPOFOL N/A 11/30/2014   Procedure: COLONOSCOPY WITH PROPOFOL;  Surgeon: Josefine Class, MD;  Location: [facility] ENDOSCOPY;  Service: Endoscopy;  Laterality: N/A;  . GASTRIC BYPASS  2016  . INCISIONAL HERNIA REPAIR Right   . LAPAROSCOPIC GASTRIC RESTRICTIVE DUODENAL PROCEDURE (DUODENAL SWITCH) N/A 06/30/2014   Procedure: LAPAROSCOPIC DUODENAL SWITCH WTIH BILIARY PANCREATIC DIVERSION ;  Surgeon: [name], MD;  Location: [facility] ORS;  Service: General;  Laterality: N/A;  . TONSILLECTOMY      There were no vitals filed for this visit.  Subjective Assessment - 03/05/17 1057    Subjective  Pt reported no difficulties with the exercise. Pt notices she is not as bloated. Pt still having bowel movements 2-3 x day, Stool type 6 , consistency of whip cream. Ever since her gastric bypass, her stools have been more liquid. Prior to that, pt was always constipated with bowel movements every 3 days                   Pelvic Floor Special Questions - 03/06/17 1108    Pelvic Floor Internal Exam  pt consented verbally without contrainidcations after being explained details to exam     Exam Type  Rectal    Palpation  tightness/tensions at 2-3, 11   o'clock, 6 oclock with"sharp" complaints, ( post Tx decreased)     Strength  fair squeeze, definite lift        [facility] Adult PT Treatment/Exercise - 03/06/17 1107      Neuro Re-ed    Neuro Re-ed Details   see pt instructions, ansswered pt's questions about dieting and deferred to internet resources and referral to nutritionist.   nutritional referral       Manual Therapy   Internal Pelvic Floor  STM at probelm areas in rectal canal                   PT Long Term Goals - 03/06/17 1113      PT LONG TERM GOAL #1   Title  Pt will report no pulling sensation in R abdominal area with L spinal rotation in order to turn body in functional activities    Time  4    Period  Weeks    Status  Achieved      PT LONG TERM GOAL #2   Title  Pt will demo no pelvic obliquties across two visits in order to promote proper pelvic floor function and return to ADLs    Time  6    Period  Weeks    Status  Achieved  PT LONG TERM GOAL #3   Title  Pt will report improved Stool Consistency from Type 6-7 to 3-5  or decreased frequency from 3-5 x/ day to 1-3 x day in order to participate in community events     Time  12    Period  Weeks    Status  Achieved      PT LONG TERM GOAL #4   Title  Pt will decrease her COREFO score from 34 % to < 29 % in order to restore GI function ( 12/6: 22%)    Time  12    Period  Weeks    Status  Achieved      PT LONG TERM GOAL #5   Title  Pt will report a decreased [place] score from 50% to <45 % in order to improve QOL  ( 12/6: 0%)     Time  12    Period  Weeks    Status  Achieved      Additional Long Term Goals   Additional Long Term Goals  Yes      PT LONG TERM GOAL #6   Title  Pt will demo proper alignment and co-activation of deep core in fitness exercises and use of low weights in order to return to fitness    Time  3    Period  Weeks    Status  On-going      PT LONG TERM GOAL #7   Title  Pt will report [place] Stool type 4-5 across 75% of her  bowel movements and is having less flatulence with foul order 50% of the time in order to improve overall health and participate in social events     Time  12    Period  Weeks    Status  New            Plan - 03/06/17 1110    Clinical Impression Statement  Pt demo'd significantly decreased posterior pelvic floor throug intrarectal treatment w/ report of decreased sharp sensations. Pt demo'd ability to perform pelvic tilt to lengthen pelvic floor mm and was educated to practice in simulated toileting posture. Pt was referred to nutritional resources and nutritionist/dietitian to help pt continue to improve her diet. Pt reports she is having slightly better formed stools but is still having flatulence with foul order.  Pt reports no pelvic pain and significantly less bloating sensation. Pt shows improved deep core strength and will benefit from skilled PT to achieve her remaining goals.     Rehab Potential  Good    PT Frequency  1x / week    PT Duration  12 weeks    PT Treatment/Interventions  ADLs/Self Care Home Management;Manual techniques;Therapeutic activities;Therapeutic exercise;Neuromuscular re-education;Moist Heat;Traction;Stair training;Functional mobility training;Gait training;Scar mobilization    Consulted and Agree with Plan of Care  Patient       Patient will benefit from skilled therapeutic intervention in order to improve the following deficits and impairments:  Pain, Postural dysfunction, Decreased strength, Decreased safety awareness, Decreased coordination, Decreased activity tolerance, Improper body mechanics, Difficulty walking, Decreased skin integrity, Decreased mobility, Decreased endurance, Decreased balance, Decreased range of motion, Hypomobility, Increased muscle spasms  Visit Diagnosis: Muscle weakness (generalized)  Unspecified lack of coordination     Problem List Patient Active Problem List   Diagnosis Date Noted  . Closed fracture of metatarsal bone  01/05/2017  . PMB (postmenopausal bleeding) 01/05/2017  . Colorectal cancer ([place]) 01/05/2017  . Genetic testing 01/01/2017  . Malignant neoplasm  of large intestine ([place]) 05/12/2015  . HLD (hyperlipidemia) 05/12/2015  . H/O bariatric surgery 09/02/2014  . Morbid obesity due to excess calories ([place]) 06/30/2014  . Essential (primary) hypertension 04/03/2014  . Beat, premature ventricular 03/05/2014  . 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 Major depressive disorder, single episode, moderate ([place]) 01/22/2014  . 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 Malignant neoplasm of sigmoid colon ([place]) 11/04/2013  . Other specified abnormal immunological findings in serum 10/30/2013  . Red blood cell antibody positive 10/30/2013  . 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 Adult BMI 30+ 10/24/2013    [name] ,PT, DPT, E-RYT  03/06/2017, 11:15 AM  [place] MAIN [facility] SERVICES [address] [place], [place], [address] Phone: [phone]   Fax:  [phone]  Name: Jessica Moody MRN: [id number] Date of Birth: 09-28-1965

## 2017-03-13 ENCOUNTER — Ambulatory Visit: Payer: PRIVATE HEALTH INSURANCE | Admitting: Physical Therapy

## 2017-03-15 ENCOUNTER — Ambulatory Visit: Payer: PRIVATE HEALTH INSURANCE | Admitting: Physical Therapy

## 2017-03-15 DIAGNOSIS — M6281 Muscle weakness (generalized): Secondary | ICD-10-CM | POA: Diagnosis not present

## 2017-03-15 DIAGNOSIS — R279 Unspecified lack of coordination: Secondary | ICD-10-CM

## 2017-03-15 NOTE — Patient Instructions (Signed)
PELVIC FLOOR / KEGEL EXERCISES   Pelvic floor/ Kegel exercises are used to strengthen the muscles in the base of your pelvis that are responsible for supporting your pelvic organs and preventing urine/feces leakage. Based on your therapist's recommendations, they can be performed while standing, sitting, or lying down. Imagine pelvic floor area as a diamond with pelvic landmarks: top =pubic bone, bottom tip=tailbone, sides=sitting bones (ischial tuberosities).    Make yourself aware of this muscle group by using these cues while coordinating your breath:  Inhale, feel pelvic floor diamond area lower like hammock towards your feet and ribcage/belly expanding. Pause. Let the exhale naturally and feel your belly sink, abdominal muscles hugging in around you and you may notice the pelvic diamond draws upward towards your head forming a umbrella shape. Give a squeeze during the exhalation like you are stopping the flow of urine. If you are squeezing the buttock muscles, try to give 50% less effort.   Common Errors:  Breath holding: If you are holding your breath, you may be bearing down against your bladder instead of pulling it up. If you belly bulges up while you are squeezing, you are holding your breath. Be sure to breathe gently in and out while exercising. Counting out loud may help you avoid holding your breath.  Accessory muscle use: You should not see or feel other muscle movement when performing pelvic floor exercises. When done properly, no one can tell that you are performing the exercises. Keep the buttocks, belly and inner thighs relaxed.  Overdoing it: Your muscles can fatigue and stop working for you if you over-exercise. You may actually leak more or feel soreness at the lower abdomen or rectum.  YOUR HOME EXERCISE PROGRAM  LONG HOLDS:   Position: on back or reclined in car seat ( do not lift head to sit up, instead make sure to use the handle to raise the car seat up and  keep spine/head relaxed to not place load on pelvic floor/ abdominal muscle)     Inhale and then exhale. Then squeeze the muscle and count aloud for 3 seconds. Rest with three long breaths. (Be sure to let belly sink in with exhales and not push outward)  Perform 5 repetitions, 3 times/day       __________  Deep core level 2  (knee out)    Pelvic tilts at work     The three yoga poses    Mountain [employer]or II:  Raise arm up in front on the same side as your front knee, back arm up behind. Arms are aligned with the length of the mat Inhale,  Exhale and relax shoulders and ribcage  Make sure 50% weight is in the front foot/leg , 50% weight is the back foot/ leg   3 breaths here.   Extended side angle :  Feet are hip width apart, L foot one behind like you are on ski tracks,  R knee bent over ankle but not more forward then the ankle.  Make sure 50% weight is in the front foot/leg , 50% weight is the back foot/ leg    Rest R forearm lightly on top of thigh,  L hand on L hip.  Inhale lengthen spine,   Exhale turn navel to the L then the ribcage turns, look at the other wall.  Keep maintaining  50% weight is in the front foot/leg , 50% weight is the back foot/ leg  And make sure the  front knee is still pointed in the toe line of the 2nd toe.   3 breaths here.

## 2017-03-15 NOTE — Therapy (Signed)
[place] MAIN [facility] SERVICES [address] [place], [place], [address] Phone: [phone]   Fax:  [phone]  Physical Therapy Treatment / Progress Note  Patient Details  Name: Jessica [place] MRN: [id number] Date of Birth: 1965/09/20 Referring Provider: Leafy Ro    Encounter Date: 03/15/2017  PT End of Session - 03/15/17 0812    Visit Number  11    Date for PT Re-Evaluation  06/07/17    PT Start Time  0805    PT Stop Time  0903    PT Time Calculation (min)  58 min    Activity Tolerance  Patient tolerated treatment well;No increased pain    Behavior During Therapy  WFL for tasks assessed/performed       Past Medical History:  Diagnosis Date  . Afib ([place])   . Cancer ([place])    colon  . Colon cancer ([place])    2015, 10 inches removed  . Depression   . Headache   . Heart murmur   . Hyperlipemia   . Hypertension   . Insomnia   . PONV (postoperative nausea and vomiting)     Past Surgical History:  Procedure Laterality Date  . APPENDECTOMY    . CESAREAN SECTION N/A    C-Section X 2  . COLON SURGERY    . COLONOSCOPY WITH PROPOFOL N/A 11/30/2014   Procedure: COLONOSCOPY WITH PROPOFOL;  Surgeon: Josefine Class, MD;  Location: [facility] END[facility]COPY;  Service: Endoscopy;  Laterality: N/A;  . GASTRIC BYPASS  2016  . INCISIONAL HERNIA REPAIR Right   . LAPAROSCOPIC GASTRIC RESTRICTIVE DUODENAL PROCEDURE (DUODENAL SWITCH) N/A 06/30/2014   Procedure: LAPAROSCOPIC DUODENAL SWITCH WTIH BILIARY PANCREATIC DIVERSION ;  Surgeon: [name], MD;  Location: [facility] ORS;  Service: General;  Laterality: N/A;  . TONSILLECTOMY      There were no vitals filed for this visit.  Subjective Assessment - 03/15/17 0808    Subjective  Pt reported she would like to join a yoga class at work. Pt notices her flatulence has occurred later into the day instead of all the day after she took her GI doctor's advise to decrease fats in her diet. Her pelvic pain is better  not perfect. She still feels 40% pelvic pain but she is able to breathe and manage.                     Pelvic Floor Special Questions - 03/15/17 0854    Prolapse  Anterior Wall posterior to pubic symphysis     Pelvic Floor Internal Exam  pt consented verablly without contrainidcations after being explained details to exam     Exam Type  Vaginal    Palpation   tightness/ tenderness at L puborectalis     Strength  good squeeze, good lift, able to hold agaisnt strong resistance initially R>L. post Tx more circumferentially. sequential    Strength # of reps  5    Strength # of seconds  3      Yoga pose: poor knee alignment, genu valgus, poor trunk / pelvis disassociation. Im proved post Tx    [facility] Adult PT Treatment/Exercise - 03/15/17 8341      Neuro Re-ed    Neuro Re-ed Details   see pt instructions with cues for propioception in three yoga poses: mountain, warrior II, extended side angle-modified . Required cues for less genu valgus and upper trap overuse      Manual Therapy   Internal Pelvic  Floor  STM at problem areas and fascial glides lateral to bladder wall and externally over suprapubic/ ischiocavernosus B to facilitate a more cranial position of bladder and stronger contraction             PT Education - 03/15/17 0903    Education provided  Yes    Education Details  HEP    Person(s) Educated  Patient    Methods  Explanation;Demonstration;Tactile cues;Verbal cues;Handout    Comprehension  Returned demonstration;Verbalized understanding;Verbal cues required;Tactile cues required          PT Long Term Goals - 03/15/17 0808      PT LONG TERM GOAL #1   Title  Pt will report no pulling sensation in R abdominal area with L spinal rotation in order to turn body in functional activities    Time  4    Period  Weeks    Status  Achieved      PT LONG TERM GOAL #2   Title  Pt will demo no pelvic obliquties across two visits in order to promote proper pelvic  floor function and return to ADLs    Time  6    Period  Weeks    Status  Achieved      PT LONG TERM GOAL #3   Title  Pt will report improved Stool Consistency from Type 6-7 to 3-5  or decreased frequency from 3-5 x/ day to 1-3 x day in order to participate in community events     Time  12    Period  Weeks    Status  Achieved      PT LONG TERM GOAL #4   Title  Pt will decrease her COREFO score from 34 % to < 29 % in order to restore GI function ( 12/6: 22%)    Time  12    Period  Weeks    Status  Achieved      PT LONG TERM GOAL #5   Title  Pt will report a decreased [place] score from 50% to <45 % in order to improve QOL  ( 12/6: 0%)     Time  12    Period  Weeks    Status  Achieved      Additional Long Term Goals   Additional Long Term Goals  Yes      PT LONG TERM GOAL #6   Title  Pt will demo proper alignment and co-activation of deep core in fitness exercises and use of low weights in order to return to fitness    Time  3    Period  Weeks    Status  On-going      PT LONG TERM GOAL #7   Title  Pt will report [place] Stool type 4-5 across 75% of her bowel movements and is having less flatulence with foul order 50% of the time in order to improve overall health and participate in social events     Time  12    Period  Weeks    Status  On-going      PT LONG TERM GOAL #8   Title  Pt will report decreased pelvic pain  from 40% to 20% pelvic pain in order to improved QOL    Time  8    Period  Weeks    Status  New    Target Date  05/10/17            Plan - 03/15/17 6761  Clinical Impression Statement Pt has achieved 5/8 goals across the past  11 visits. Pt has demonstrated signficantly decreased pelvic floor  tightness and tenderness and achieved proper pelvic floor ROM and coordiantion with deep core mm. Pt is now progressing to pelvic floor strengthening. Anticipate this imporvement will help with her remaining pelvic pain which she reports to be at 40% compared to  prior to PT. Pt's bowel movements and stool consistency have also improved but remain an area of focus for rehab. Pt no longer has R abdominal pulling pain. Pt is progressing well towards fitness. Initiated modified yoga poses today as pt intends to take yoga classes. Pt tolerated manual Tx today without complaints and showed a more cranial position of her bladder and achieved in increased grade strength in her pelvic floor mm in a more circumferential and sequential pattern of all 3 mm layers. Pt continues to  benefit from skilled PT.       Rehab Potential  Good    PT Frequency  1x / week    PT Duration  12 weeks    PT Treatment/Interventions  ADLs/Self Care Home Management;Manual techniques;Therapeutic activities;Therapeutic exercise;Neuromuscular re-education;Moist Heat;Traction;Stair training;Functional mobility training;Gait training;Scar mobilization    Consulted and Agree with Plan of Care  Patient       Patient will benefit from skilled therapeutic intervention in order to improve the following deficits and impairments:  Pain, Postural dysfunction, Decreased strength, Decreased safety awareness, Decreased coordination, Decreased activity tolerance, Improper body mechanics, Difficulty walking, Decreased skin integrity, Decreased mobility, Decreased endurance, Decreased balance, Decreased range of motion, Hypomobility, Increased muscle spasms  Visit Diagnosis: Muscle weakness (generalized)  Unspecified lack of coordination     Problem List Patient Active Problem List   Diagnosis Date Noted  . Closed fracture of metatarsal bone 01/05/2017  . PMB (postmenopausal bleeding) 01/05/2017  . Colorectal cancer ([place]) 01/05/2017  . Genetic testing 01/01/2017  . 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 HLD (hyperlipidemia) 05/12/2015  . H/O bariatric surgery 09/02/2014  . Morbid obesity due to excess calories ([place]) 06/30/2014  . Essential (primary) hypertension 04/03/2014   . Beat, premature ventricular 03/05/2014  . 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 Major depressive disorder, single episode, moderate ([place]) 01/22/2014  . 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 Malignant neoplasm of sigmoid colon ([place]) 11/04/2013  . Other specified abnormal immunological findings in serum 10/30/2013  . Red blood cell antibody positive 10/30/2013  . 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 Adult BMI 30+ 10/24/2013    [name] ,PT, DPT, E-RYT  03/15/2017, 9:08 AM  [place] MAIN [facility] SERVICES [address] [place], [place], [address] Phone: [phone]   Fax:  [phone]  Name: Jessica [place] MRN: [id number] Date of Birth: [birth year]

## 2017-03-16 ENCOUNTER — Ambulatory Visit (INDEPENDENT_AMBULATORY_CARE_PROVIDER_SITE_OTHER): Payer: Self-pay | Admitting: Podiatry

## 2017-03-16 DIAGNOSIS — L603 Nail dystrophy: Secondary | ICD-10-CM

## 2017-03-16 DIAGNOSIS — B351 Tinea unguium: Secondary | ICD-10-CM

## 2017-03-16 MED ORDER — NONFORMULARY OR COMPOUNDED ITEM: 1.0000 g | Freq: Every day | 11 refills | Status: DC

## 2017-03-16 NOTE — Progress Notes (Signed)
Pt presents with mycotic infection of nails 1-5 bilateral  All other systems are negative  Laser therapy administered to affected nails and tolerated well. All safety precautions were in place. Re-appointed prn. Rx for [employer] nail topical was written and faxed

## 2017-03-29 ENCOUNTER — Encounter: Payer: PRIVATE HEALTH INSURANCE | Admitting: Physical Therapy

## 2017-03-30 ENCOUNTER — Encounter: Payer: PRIVATE HEALTH INSURANCE | Admitting: Physical Therapy

## 2017-04-10 ENCOUNTER — Ambulatory Visit: Payer: PRIVATE HEALTH INSURANCE | Attending: Obstetrics and Gynecology | Admitting: Physical Therapy

## 2017-04-10 DIAGNOSIS — R279 Unspecified lack of coordination: Secondary | ICD-10-CM | POA: Insufficient documentation

## 2017-04-10 DIAGNOSIS — M6281 Muscle weakness (generalized): Secondary | ICD-10-CM | POA: Insufficient documentation

## 2017-04-12 NOTE — Patient Instructions (Addendum)
Stay compliant with exercises  Pelvic tilts when sitting  Breathing with more diaphragmatic expansion --> practice with scarf at ribs, slightly snug and notice the expansion with inhalation

## 2017-04-12 NOTE — Therapy (Signed)
[place] MAIN [facility] SERVICES [address] [place], [place], [address] Phone: [phone]   Fax:  [phone]  Physical Therapy Treatment  Patient Details  Name: Jessica [place] MRN: [id number] Date of Birth: Jan 30, 1965 Referring Provider: Leafy Ro    Encounter Date: 04/10/2017  PT End of Session - 04/12/17 1356    Visit Number  12    Date for PT Re-Evaluation  06/07/17    PT Start Time  0810    PT Stop Time  0905    PT Time Calculation (min)  55 min    Activity Tolerance  Patient tolerated treatment well;No increased pain    Behavior During Therapy  WFL for tasks assessed/performed       Past Medical History:  Diagnosis Date  . Afib ([place])   . Cancer ([place])    colon  . Colon cancer ([place])    2015, 10 inches removed  . Depression   . Headache   . Heart murmur   . Hyperlipemia   . Hypertension   . Insomnia   . PONV (postoperative nausea and vomiting)     Past Surgical History:  Procedure Laterality Date  . APPENDECTOMY    . CESAREAN SECTION N/A    C-Section X 2  . COLON SURGERY    . COLONOSCOPY WITH PROPOFOL N/A 11/30/2014   Procedure: COLONOSCOPY WITH PROPOFOL;  Surgeon: Josefine Class, MD;  Location: [facility] ENDOSCOPY;  Service: Endoscopy;  Laterality: N/A;  . GASTRIC BYPASS  2016  . INCISIONAL HERNIA REPAIR Right   . LAPAROSCOPIC GASTRIC RESTRICTIVE DUODENAL PROCEDURE (DUODENAL SWITCH) N/A 06/30/2014   Procedure: LAPAROSCOPIC DUODENAL SWITCH WTIH BILIARY PANCREATIC DIVERSION ;  Surgeon: [name], MD;  Location: [facility] ORS;  Service: General;  Laterality: N/A;  . TONSILLECTOMY      There were no vitals filed for this visit.      [facility] PT Assessment - 04/12/17 1350      Observation/Other Assessments   Observations  chest breathing in seated position. increased bulging at R lower abdominal compared to L when standing ( pt reported she had not been doing her strengthenign exercises as often)                Pelvic Floor Special Questions - 04/12/17 1347    Pelvic Floor Internal Exam  pt consented verbally without contrainidcations after being explained details to exam     Exam Type  Rectal    Palpation  increased tightness at posterior puborectalis, EAS at 6 clock ( R sidelying)     Strength  fair squeeze, definite lift        [facility] Adult PT Treatment/Exercise - 04/12/17 1348      Neuro Re-ed    Neuro Re-ed Details   see pt instructions with cues for propioception in three yoga poses: mountain, warrior II, extended side angle-modified nutritional referral       Manual Therapy   Internal Pelvic Floor  STM at problem areas             PT Education - 04/12/17 1349    Education provided  Yes    Education Details  HEP    Person(s) Educated  Patient    Methods  Explanation;Demonstration;Verbal cues;Handout    Comprehension  Returned demonstration          PT Long Term Goals - 04/10/17 0815      PT LONG TERM GOAL #1   Title  Pt will report no  pulling sensation in R abdominal area with L spinal rotation in order to turn body in functional activities    Time  4    Period  Weeks    Status  Achieved      PT LONG TERM GOAL #2   Title  Pt will demo no pelvic obliquties across two visits in order to promote proper pelvic floor function and return to ADLs    Time  6    Period  Weeks    Status  Achieved      PT LONG TERM GOAL #3   Title  Pt will report improved Stool Consistency from Type 6-7 to 3-5  or decreased frequency from 3-5 x/ day to 1-3 x day in order to participate in community events     Time  12    Period  Weeks    Status  Achieved      PT LONG TERM GOAL #4   Title  Pt will decrease her COREFO score from 34 % to < 29 % in order to restore GI function ( 12/6: 22%)    Time  12    Period  Weeks    Status  Achieved      PT LONG TERM GOAL #5   Title  Pt will report a decreased [place] score from 50% to <45 % in order to improve QOL  ( 12/6: 0%)      Time  12    Period  Weeks    Status  Achieved      PT LONG TERM GOAL #6   Title  Pt will demo proper alignment and co-activation of deep core in fitness exercises and use of low weights in order to return to fitness    Time  3    Period  Weeks    Status  Achieved      PT LONG TERM GOAL #7   Title  Pt will report [place] Stool type 4-5 across 75% of her bowel movements and is having less flatulence with foul order 50% of the time in order to improve overall health and participate in social events     Time  12    Period  Weeks    Status  Partially Met      PT LONG TERM GOAL #8   Title  Pt will report decreased pelvic pain  from 40% to 20% pelvic pain in order to improved QOL    Time  8    Period  Weeks    Status  Achieved            Plan - 04/12/17 1351    Clinical Impression Statement  Pt has achieved 7/8 goals and is progressing well twoards her remaining goal. Pt showed increased tightness at posterior mm through rectal assessment and demo'd less tensions/ tenderness post Tx. Pt required cuing for proper lengthening of pelvic floor mm as pt demo'd overuse of upper trap mm. Anticipate this Tx will help pt to eliminate more completely  which is a remaining issue of hers. Pt also required encouragement to be more compliant with her deep core/ oblique strengthening exercises as they had helped with creating more intraabdominal support, especially around her R hernia in prior visits. Pt showed a slight relapse of more bulging on R lower abdomen today as she returned to PT after one month.  Pt voiced understanding to return to her exercises. Pt will likelt be d/c at next session.     Rehab  Potential  Good    PT Frequency  1x / week    PT Duration  12 weeks    PT Treatment/Interventions  ADLs/Self Care Home Management;Manual techniques;Therapeutic activities;Therapeutic exercise;Neuromuscular re-education;Moist Heat;Traction;Stair training;Functional mobility training;Gait  training;Scar mobilization    Consulted and Agree with Plan of Care  Patient       Patient will benefit from skilled therapeutic intervention in order to improve the following deficits and impairments:  Pain, Postural dysfunction, Decreased strength, Decreased safety awareness, Decreased coordination, Decreased activity tolerance, Improper body mechanics, Difficulty walking, Decreased skin integrity, Decreased mobility, Decreased endurance, Decreased balance, Decreased range of motion, Hypomobility, Increased muscle spasms  Visit Diagnosis: Muscle weakness (generalized)  Unspecified lack of coordination     Problem List Patient Active Problem List   Diagnosis Date Noted  . Closed fracture of metatarsal bone 01/05/2017  . PMB (postmenopausal bleeding) 01/05/2017  . Colorectal cancer ([place]) 01/05/2017  . Genetic testing 01/01/2017  . 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 HLD (hyperlipidemia) 05/12/2015  . H/O bariatric surgery 09/02/2014  . Morbid obesity due to excess calories ([place]) 06/30/2014  . Essential (primary) hypertension 04/03/2014  . Beat, premature ventricular 03/05/2014  . 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 Major depressive disorder, single episode, moderate ([place]) 01/22/2014  . 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 Malignant neoplasm of sigmoid colon ([place]) 11/04/2013  . Other specified abnormal immunological findings in serum 10/30/2013  . Red blood cell antibody positive 10/30/2013  . 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 Adult BMI 30+ 10/24/2013    [name] ,PT, DPT, E-RYT  04/12/2017, 1:57 PM  [place] MAIN [facility] SERVICES [address] [place], [place], [address] Phone: [phone]   Fax:  [phone]  Name: Jessica [place] MRN: [id number] Date of Birth: [birth year]

## 2017-05-10 ENCOUNTER — Ambulatory Visit: Payer: PRIVATE HEALTH INSURANCE | Attending: Obstetrics and Gynecology | Admitting: Physical Therapy

## 2017-05-10 DIAGNOSIS — M6281 Muscle weakness (generalized): Secondary | ICD-10-CM | POA: Insufficient documentation

## 2017-05-10 DIAGNOSIS — M791 Myalgia, unspecified site: Secondary | ICD-10-CM | POA: Insufficient documentation

## 2017-05-10 DIAGNOSIS — R279 Unspecified lack of coordination: Secondary | ICD-10-CM | POA: Insufficient documentation

## 2017-05-10 NOTE — Patient Instructions (Signed)
Follow up with n utritionist and  Leafy Ro about persisting pubic pain    15 min after work for walking and deep core and band exercise   At work get up and do exercises even if you are not feeling tenderness hip ( MAINTENANCE)    Organize folders and try 3 recipes in the Cancer Recipe book and take notes

## 2017-05-10 NOTE — Therapy (Signed)
[place] MAIN [facility] SERVICES [address] [place], [place], [address] Phone: [phone]   Fax:  [phone]  Physical Therapy Treatment  Patient Details  Name: Jessica Moody MRN: [id number] Date of Birth: [birth year] Referring Provider: Leafy Ro    Encounter Date: 05/10/2017    Past Medical History:  Diagnosis Date  . Afib ([place])   . Cancer ([place])    colon  . Colon cancer ([place])    2015, 10 inches removed  . Depression   . Headache   . Heart murmur   . Hyperlipemia   . Hypertension   . Insomnia   . PONV (postoperative nausea and vomiting)     Past Surgical History:  Procedure Laterality Date  . APPENDECTOMY    . CESAREAN SECTION N/A    C-Section X 2  . COLON SURGERY    . COLONOSCOPY WITH PROPOFOL N/A 11/30/2014   Procedure: COLONOSCOPY WITH PROPOFOL;  Surgeon: Josefine Class, MD;  Location: [facility] ENDOSCOPY;  Service: Endoscopy;  Laterality: N/A;  . GASTRIC BYPASS  2016  . INCISIONAL HERNIA REPAIR Right   . LAPAROSCOPIC GASTRIC RESTRICTIVE DUODENAL PROCEDURE (DUODENAL SWITCH) N/A 06/30/2014   Procedure: LAPAROSCOPIC DUODENAL SWITCH WTIH BILIARY PANCREATIC DIVERSION ;  Surgeon: [name], MD;  Location: [facility] ORS;  Service: General;  Laterality: N/A;  . TONSILLECTOMY      There were no vitals filed for this visit.  Subjective Assessment - 05/10/17 0804    Subjective  Pt would like to join a gym to firm up her abdominal area. Pt has been having the achiness at the pubic area at the same level of pain 3/10 which previously she had noticed off and on but recently she has noticed it more everyday. Pt has been told she has a uterine fibroid ( small size) and she feels the achiness to be like a period.  The pain with intercourse has resolved .The loose stoolsare still a problem but after last session she had a slightly better formed stool after last session with rectal manual Tx. Pt notices she she cooks / eat with greasy, her stools  are worse. Pt still has not seen a nutritionist but she has bought a nutrition book which she has flipped through but some of the material has gone over her head.                       Pelvic Floor Special Questions - 05/10/17 0911    Pelvic Floor Internal Exam  pt consented verbally without contrainidcations after being explained details to exam     Exam Type  Rectal    Palpation  increased tightness at posterior puborectalis, EAS at 6 -3 oclock  ( R sidelying)     Strength  fair squeeze, definite lift        [facility] Adult PT Treatment/Exercise - 05/10/17 0911      Therapeutic Activites    Therapeutic Activities  -- see pt instructions       Manual Therapy   Internal Pelvic Floor  STM at problem areas                  PT Long Term Goals - 05/10/17 0824      PT LONG TERM GOAL #1   Title  Pt will report no pulling sensation in R abdominal area with L spinal rotation in order to turn body in functional activities    Time  4  Period  Weeks    Status  Achieved      PT LONG TERM GOAL #2   Title  Pt will demo no pelvic obliquties across two visits in order to promote proper pelvic floor function and return to ADLs    Time  6    Period  Weeks    Status  Achieved      PT LONG TERM GOAL #3   Title  Pt will report improved Stool Consistency from Type 6-7 to 3-5  or decreased frequency from 3-5 x/ day to 1-3 x day in order to participate in community events     Time  12    Period  Weeks    Status  Achieved      PT LONG TERM GOAL #4   Title  Pt will decrease her COREFO score from 34 % to < 29 % in order to restore GI function ( 12/6: 22%)    Time  12    Period  Weeks    Status  Achieved      PT LONG TERM GOAL #5   Title  Pt will report a decreased [place] score from 50% to <45 % in order to improve QOL  ( 12/6: 0%)     Time  12    Period  Weeks    Status  Achieved      PT LONG TERM GOAL #6   Title  Pt will demo proper alignment and co-activation of deep  core in fitness exercises and use of low weights in order to return to fitness    Time  3    Period  Weeks    Status  Achieved      PT LONG TERM GOAL #7   Title  Pt will report [place] Stool type 4-5 across 75% of her bowel movements and is having less flatulence with foul order 50% of the time in order to improve overall health and participate in social events     Time  12    Period  Weeks    Status  Partially Met      PT LONG TERM GOAL #8   Title  Pt will report decreased pelvic pain  from 40% to 20% pelvic pain in order to improved QOL    Time  8    Period  Weeks    Status  Achieved            Plan - 05/10/17 0949    Clinical Impression Statement  Pt showed increased rectal mm lengthening with intrarectal manual Tx today as pt showed decreased posterior mm tightness. Pt had better formed stools after last session which had also applied rectal manual releases. Plan to continue address rectal lengthening given pt had surgerical removal of colon 2/2 Stage I CA.  Pt required motivational interviewing to strategize complaince to HEP and navigating next steps to add nutritionist to her team to address her loose stools. She had had  Hx of colon CA and gastric bypass and notices her GI system has lowered tolerance to fatty foods.  Educated pt on the importance of maintaining deep core strengthening and oblique mm strengthening.  Educated on long term plan towards wellness in the gym setting and her need for supervised exercsise physiologist in [place] at [facility] given her cardiac issues in the past. Withholding progression into use of gym machines until pt has resumed a consistent HEP program for deep core strengthening to minimize worsening of her hernia  located at R lower abdominal area.    One area of concern which PT will communicate with her GYN MD about is her report of persistent pain at suprapubic area .  Pt would benefit from a work up as pt reports Hx of uterine fibroids. Pt has  an upcoming CT as part of  her 9 month f/u with CA MD.   Pt continues beenfit from skilled PT.      Rehab Potential  Good    PT Frequency  1x / week    PT Duration  12 weeks    PT Treatment/Interventions  ADLs/Self Care Home Management;Manual techniques;Therapeutic activities;Therapeutic exercise;Neuromuscular re-education;Moist Heat;Traction;Stair training;Functional mobility training;Gait training;Scar mobilization    Consulted and Agree with Plan of Care  Patient       Patient will benefit from skilled therapeutic intervention in order to improve the following deficits and impairments:  Pain, Postural dysfunction, Decreased strength, Decreased safety awareness, Decreased coordination, Decreased activity tolerance, Improper body mechanics, Difficulty walking, Decreased skin integrity, Decreased mobility, Decreased endurance, Decreased balance, Decreased range of motion, Hypomobility, Increased muscle spasms  Visit Diagnosis: No diagnosis found.     Problem List Patient Active Problem List   Diagnosis Date Noted  . Closed fracture of metatarsal bone 01/05/2017  . PMB (postmenopausal bleeding) 01/05/2017  . Colorectal cancer ([place]) 01/05/2017  . Genetic testing 01/01/2017  . 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 HLD (hyperlipidemia) 05/12/2015  . H/O bariatric surgery 09/02/2014  . Morbid obesity due to excess calories ([place]) 06/30/2014  . Essential (primary) hypertension 04/03/2014  . Beat, premature ventricular 03/05/2014  . 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 Major depressive disorder, single episode, moderate ([place]) 01/22/2014  . 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 Other specified abnormal immunological findings in serum 10/30/2013  . Red blood cell antibody positive 10/30/2013  . 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 Adult BMI 30+ 10/24/2013    [name] 05/10/2017, 9:59 AM  [place] MAIN [facility] SERVICES [address] [place], [place], [address] Phone: [phone]   Fax:  [phone]  Name: Jessica Moody MRN: [id number] Date of Birth: 1965/04/10

## 2017-05-11 ENCOUNTER — Ambulatory Visit: Payer: PRIVATE HEALTH INSURANCE | Admitting: Physical Therapy

## 2017-05-16 ENCOUNTER — Other Ambulatory Visit: Payer: Self-pay | Admitting: Cardiology

## 2017-05-16 NOTE — Telephone Encounter (Signed)
Pt saw Dr Curt Bears on 01/05/17, weight at that time was 80.3Kg. Age 52 yrs old.  Last note serum creatine 0.57 on 10/13/16 in epic. CrCl is 146 mL/min. Will refill Xarelto 20mg  QD.

## 2017-05-22 ENCOUNTER — Encounter: Payer: Self-pay | Admitting: Cardiology

## 2017-07-02 ENCOUNTER — Encounter: Payer: Self-pay | Admitting: Cardiology

## 2017-07-02 ENCOUNTER — Ambulatory Visit: Payer: PRIVATE HEALTH INSURANCE | Admitting: Cardiology

## 2017-07-02 VITALS — BP 100/66 | HR 66 | Ht 64.0 in | Wt 179.0 lb

## 2017-07-02 DIAGNOSIS — I480 Paroxysmal atrial fibrillation: Secondary | ICD-10-CM

## 2017-07-02 DIAGNOSIS — I5022 Chronic systolic (congestive) heart failure: Secondary | ICD-10-CM | POA: Diagnosis not present

## 2017-07-02 NOTE — Patient Instructions (Signed)
Medication Instructions:  Your physician recommends that you continue on your current medications as directed. Please refer to the Current Medication list given to you today.  Labwork: None ordered  Testing/Procedures: None ordered  Follow-Up: Your physician wants you to follow-up in: 6 months with Dr. Camnitz.  You will receive a reminder letter in the mail two months in advance. If you don't receive a letter, please call our office to schedule the follow-up appointment.  * If you need a refill on your cardiac medications before your next appointment, please call your pharmacy.   *Please note that any paperwork needing to be filled out by the provider will need to be addressed at the front desk prior to seeing the provider. Please note that any FMLA, disability or other documents regarding health condition is subject to a $25.00 charge that must be received prior to completion of paperwork in the form of a money order or check.  Thank you for choosing [facility]!!   Ellinore Merced, RN [phone]        

## 2017-07-02 NOTE — Progress Notes (Signed)
Electrophysiology Office Note   Date:  07/02/2017   ID:  Quetzal, Meany 14-Sep-1965, MRN [id number]  PCP:  Kirk Ruths, MD  Cardiologist:   Primary Electrophysiologist:  Amelita Risinger Meredith Leeds, MD    No chief complaint on file.    History of Present Illness: Jessica Moody is a 52 y.o. female who is being seen today for the evaluation of atrial fibrillaiton at the request of Kirk Ruths, MD. She has a history of colon cancer, hyperlipidemia, and hypertension. Presents today as follow up of AF and new onset mildly decreased LVEF.  She woke from sleep with palpitations and was found to be in AF. Had fatigue and SOB. Went to PCP and was found to be in AF. Converted 2 days later. TTE showed EF 40-45%. Myoview was low risk.  Today, denies symptoms of palpitations, chest pain, shortness of breath, orthopnea, PND, lower extremity edema, claudication, dizziness, presyncope, syncope, bleeding, or neurologic sequela. The patient is tolerating medications without difficulties.  Overall she is feeling well.  She feels that she is in atrial fibrillation for a few minutes at a time 2-3 times per month.  She is content with her atrial fibrillation control at this point.  Past Medical History:  Diagnosis Date  . Afib ([place])   . Cancer ([place])    colon  . Colon cancer ([place])    2015, 10 inches removed  . Depression   . Headache   . Heart murmur   . Hyperlipemia   . Hypertension   . Insomnia   . PONV (postoperative nausea and vomiting)    Past Surgical History:  Procedure Laterality Date  . APPENDECTOMY    . CESAREAN SECTION N/A    C-Section X 2  . COLON SURGERY    . COLONOSCOPY WITH PROPOFOL N/A 11/30/2014   Procedure: COLONOSCOPY WITH PROPOFOL;  Surgeon: Josefine Class, MD;  Location: [facility] ENDOSCOPY;  Service: Endoscopy;  Laterality: N/A;  . GASTRIC BYPASS  2016  . INCISIONAL HERNIA REPAIR Right   . LAPAROSCOPIC GASTRIC RESTRICTIVE DUODENAL PROCEDURE (DUODENAL SWITCH) N/A  06/30/2014   Procedure: LAPAROSCOPIC DUODENAL SWITCH WTIH BILIARY PANCREATIC DIVERSION ;  Surgeon: [name], MD;  Location: [facility] ORS;  Service: General;  Laterality: N/A;  . TONSILLECTOMY       Current Outpatient Medications  Medication Sig Dispense Refill  . Calcium-Magnesium-Vitamin D (CALCIUM 1200+D3 PO) Take 2 capsules by mouth 2 (two) times daily.     Marland Kitchen estradiol (ESTRACE) 0.1 MG/GM vaginal cream Place 1 Applicatorful vaginally 2 (two) times a week.    . losartan (COZAAR) 25 MG tablet Take 1 tablet (25 mg total) by mouth daily. 90 tablet 3  . metoprolol succinate (TOPROL-XL) 50 MG 24 hr tablet Take 50 mg by mouth daily.    . Multiple Vitamin (MULTIVITAMIN) tablet Take 1 tablet by mouth daily.    . NONFORMULARY OR COMPOUNDED ITEM Apply 1-2 g topically daily. [employer] Nail lacquer: Fluconazole 2%, Terbinafine 1%, DMSO 120 each 11  . XARELTO 20 MG TABS tablet TAKE 1 TABLET(20 MG) BY MOUTH DAILY WITH SUPPER 30 tablet 6   No current facility-administered medications for this visit.     Allergies:   Oxycodone; Poison ivy extract; Aloe vera; Diclofenac sodium; Nsaids; and Prednisone   Social History:  The patient  reports that she quit smoking about 18 years ago. Her smoking use included cigarettes. She smoked 1.00 pack per day. She has never used smokeless tobacco. She reports that  she does not drink alcohol or use drugs.   Family History:  The patient's family history includes Diabetes type II in her mother; Heart failure in her mother; Hypertension in her mother; Kidney disease in her mother.   ROS:  Please see the history of present illness.   Otherwise, review of systems is positive for leg swelling, palpitations, anxiety, back pain, dizziness, balance.   All other systems are reviewed and negative.   PHYSICAL EXAM: VS:  BP 100/66   Pulse 66   Ht 5\' 4"  (1.626 m)   Wt 179 lb (81.2 kg)   LMP 09/29/2015   BMI 30.73 kg/m  , BMI Body mass index is 30.73 kg/m. GEN: Well  nourished, well developed, in no acute distress  HEENT: normal  Neck: no JVD, carotid bruits, or masses Cardiac: RRR; no murmurs, rubs, or gallops,no edema  Respiratory:  clear to auscultation bilaterally, normal work of breathing GI: soft, nontender, nondistended, + BS MS: no deformity or atrophy  Skin: warm and dry Neuro:  Strength and sensation are intact Psych: euthymic mood, full affect  EKG:  EKG is ordered today. Personal review of the ekg ordered shows sinus rhythm, nonspecific T wave changes, rate 66  Recent Labs: 10/13/2016: BUN 11; Creatinine, Ser 0.57; Potassium 3.8; Sodium 143    Lipid Panel  No results found for: CHOL, TRIG, HDL, CHOLHDL, VLDL, LDLCALC, LDLDIRECT   Wt Readings from Last 3 Encounters:  07/02/17 179 lb (81.2 kg)  01/05/17 177 lb (80.3 kg)  10/04/16 165 lb 12.8 oz (75.2 kg)      Other studies Reviewed: Additional studies/ records that were reviewed today include: TTE 09/20/16 - Left ventricle: The cavity size was mildly dilated. Wall   thickness was normal. Systolic function was mildly to moderately   reduced. The estimated ejection fraction was in the range of 40%   to 45%. Diffuse hypokinesis. - Aortic valve: There was mild regurgitation. - Right ventricle: The cavity size was mildly dilated. Wall   thickness was normal.  Myoview 10/03/16  The left ventricular ejection fraction is mildly decreased (45-54%).  Nuclear stress EF: 47%.  There was no ST segment deviation noted during stress.  Defect 1: There is a small defect of moderate severity present in the apex location.  This is a low risk study.   Low risk stress nuclear study with very mild apical ischemia; EF 47 with global hypokinesis and mild LVE.   ASSESSMENT AND PLAN:  1.   Paroxysmal atrial fibrillation: Currently on Toprol-XL and Xarelto.  Tolerating medications well.  Has atrial fibrillation twice a month for short episodes.  No changes.  Not interested in further  interventions.  This patients CHA2DS2-VASc Score and unadjusted Ischemic Stroke Rate (% per year) is equal to 2.2 % stroke rate/year from a score of 2  Above score calculated as 1 point each if present [CHF, HTN, DM, Vascular=MI/PAD/Aortic Plaque, Age if 65-74, or Female] Above score calculated as 2 points each if present [Age > 75, or Stroke/TIA/TE]   2. Hypertension: Blood pressure issues have resolved after gastric bypass  3. Systolic heart failure, chronicity unknown: Ejection 40 to 45%.  Currently on Toprol-XL and losartan.  Asymptomatic at this time.  Current medicines are reviewed at length with the patient today.   The patient does not have concerns regarding her medicines.  The following changes were made today:  none  Labs/ tests ordered today include:  Orders Placed This Encounter  Procedures  . EKG 12-Lead  Disposition:   FU with Partick Musselman 6 months  Signed, Jerricka Carvey Meredith Leeds, MD  07/02/2017 2:52 PM     [place] [address] [place] [address] [phone] (office) [phone] (fax)

## 2017-07-05 ENCOUNTER — Ambulatory Visit: Payer: PRIVATE HEALTH INSURANCE | Admitting: Cardiology

## 2017-07-13 ENCOUNTER — Other Ambulatory Visit: Payer: Self-pay | Admitting: Cardiology

## 2017-08-07 ENCOUNTER — Ambulatory Visit: Payer: PRIVATE HEALTH INSURANCE | Admitting: Podiatry

## 2017-08-14 ENCOUNTER — Emergency Department: Payer: Worker's Compensation

## 2017-08-14 ENCOUNTER — Encounter: Payer: Self-pay | Admitting: Emergency Medicine

## 2017-08-14 ENCOUNTER — Emergency Department
Admission: EM | Admit: 2017-08-14 | Discharge: 2017-08-14 | Disposition: A | Discharge status: Home / Self Care | Payer: Worker's Compensation | Attending: Emergency Medicine | Admitting: Emergency Medicine

## 2017-08-14 DIAGNOSIS — W01198A Fall on same level from slipping, tripping and stumbling with subsequent striking against other object, initial encounter: Secondary | ICD-10-CM | POA: Diagnosis not present

## 2017-08-14 DIAGNOSIS — Z7901 Long term (current) use of anticoagulants: Secondary | ICD-10-CM | POA: Insufficient documentation

## 2017-08-14 DIAGNOSIS — Y990 Civilian activity done for income or pay: Secondary | ICD-10-CM | POA: Diagnosis not present

## 2017-08-14 DIAGNOSIS — S0990XA Unspecified injury of head, initial encounter: Secondary | ICD-10-CM | POA: Insufficient documentation

## 2017-08-14 DIAGNOSIS — Z79899 Other long term (current) drug therapy: Secondary | ICD-10-CM | POA: Insufficient documentation

## 2017-08-14 DIAGNOSIS — W19XXXA Unspecified fall, initial encounter: Secondary | ICD-10-CM

## 2017-08-14 DIAGNOSIS — Z87891 Personal history of nicotine dependence: Secondary | ICD-10-CM | POA: Insufficient documentation

## 2017-08-14 DIAGNOSIS — Y9301 Activity, walking, marching and hiking: Secondary | ICD-10-CM | POA: Diagnosis not present

## 2017-08-14 DIAGNOSIS — Y929 Unspecified place or not applicable: Secondary | ICD-10-CM | POA: Insufficient documentation

## 2017-08-14 DIAGNOSIS — I10 Essential (primary) hypertension: Secondary | ICD-10-CM | POA: Insufficient documentation

## 2017-08-14 MED ORDER — TRAMADOL HCL 50 MG PO TABS
50.0000 mg | ORAL_TABLET | Freq: Once | ORAL | Status: AC
  Administered 2017-08-14: 50 mg via ORAL
  Filled 2017-08-14: qty 1

## 2017-08-14 MED ORDER — ACETAMINOPHEN 500 MG PO TABS: 500.0000 mg | ORAL_TABLET | Freq: Four times a day (QID) | ORAL | 0 refills | Status: AC | PRN

## 2017-08-14 NOTE — ED Provider Notes (Signed)
[facility] Emergency Department Provider Note  ____________________________________________  Time seen: Approximately 5:45 PM  I have reviewed the triage vital signs and the nursing notes.   HISTORY  Chief Complaint Fall    HPI Jessica Moody is a 52 y.o. female presents emergency department for evaluation of headache and left great toe pain after falling at work today.  Patient states that she tripped on a puddle of water at work.  She fell backwards and hit her head.  Ankle was originally hurting but this has resolved.    She does not think that her toe is broken.  She did not lose consciousness.  She takes Xarelto. No dizziness, confusion, visual changes, nausea, vomiting.  Past Medical History:  Diagnosis Date  . Afib ([place])   . Cancer ([place])    colon  . Colon cancer ([place])    2015, 10 inches removed  . Depression   . Headache   . Heart murmur   . Hyperlipemia   . Hypertension   . Insomnia   . PONV (postoperative nausea and vomiting)     Patient Active Problem List   Diagnosis Date Noted  . Closed fracture of metatarsal bone 01/05/2017  . PMB (postmenopausal bleeding) 01/05/2017  . Colorectal cancer ([place]) 01/05/2017  . Genetic testing 01/01/2017  . 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 HLD (hyperlipidemia) 05/12/2015  . H/O bariatric surgery 09/02/2014  . Morbid obesity due to excess calories ([place]) 06/30/2014  . Essential (primary) hypertension 04/03/2014  . Beat, premature ventricular 03/05/2014  . 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 Major depressive disorder, single episode, moderate ([place]) 01/22/2014  . 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 Malignant neoplasm of sigmoid colon ([place]) 11/04/2013  . Other specified abnormal immunological findings in serum 10/30/2013  . Red blood cell antibody positive 10/30/2013  . 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 Adult BMI 30+ 10/24/2013    Past Surgical History:  Procedure  Laterality Date  . APPENDECTOMY    . CESAREAN SECTION N/A    C-Section X 2  . COLON SURGERY    . COLONOSCOPY WITH PROPOFOL N/A 11/30/2014   Procedure: COLONOSCOPY WITH PROPOFOL;  Surgeon: Josefine Class, MD;  [facility] END[facility];  Service: Endoscopy;  Laterality: N/A;  . GASTRIC BYPASS  2016  . INCISIONAL HERNIA REPAIR Right   . LAPAROSCOPIC GASTRIC RESTRICTIVE DUODENAL PROCEDURE (DUODENAL SWITCH) N/A 06/30/2014   Procedure: LAPAROSCOPIC DUODENAL SWITCH WTIH BILIARY PANCREATIC DIVERSION ;  Surgeon: [name], MD;  Location: [facility] ORS;  Service: General;  Laterality: N/A;  . TONSILLECTOMY      Prior to Admission medications   Medication Sig Start Date End Date Taking? Authorizing Provider  acetaminophen (TYLENOL) 500 MG tablet Take 1 tablet (500 mg total) by mouth every 6 (six) hours as needed. 08/14/17   Laban Emperor, PA-C  Calcium-Magnesium-Vitamin D (CALCIUM 1200+D3 PO) Take 2 capsules by mouth 2 (two) times daily.     [provider]  estradiol (ESTRACE) 0.1 MG/GM vaginal cream Place 1 Applicatorful vaginally 2 (two) times a week.    [provider]  losartan (COZAAR) 25 MG tablet TAKE 1 TABLET(25 MG) BY MOUTH DAILY 07/16/17   Camnitz, Ocie Doyne, MD  metoprolol succinate (TOPROL-XL) 50 MG 24 hr tablet Take 50 mg by mouth daily. 09/05/16 09/05/17  [provider]  Multiple Vitamin (MULTIVITAMIN) tablet Take 1 tablet by mouth daily.    [provider]  NONFORMULARY OR COMPOUNDED ITEM Apply 1-2 g  topically daily. [employer] Nail lacquer: Fluconazole 2%, Terbinafine 1%, DMSO 03/16/17   Hyatt, Max T, DPM  XARELTO 20 MG TABS tablet TAKE 1 TABLET(20 MG) BY MOUTH DAILY WITH SUPPER 05/16/17   Camnitz, Ocie Doyne, MD    Allergies Oxycodone; Poison ivy extract; Aloe vera; Diclofenac sodium; Nsaids; and Prednisone  Family History  Problem Relation Age of Onset  . Diabetes type II Mother   . Hypertension Mother   . Heart failure Mother   . Kidney  disease Mother   . Breast cancer Neg Hx     Social History Social History   Tobacco Use  . Smoking status: Former Smoker    Packs/day: 1.00    Types: Cigarettes    Last attempt to quit: 08/24/1998    Years since quitting: 18.9  . Smokeless tobacco: Never Used  Substance Use Topics  . Alcohol use: No  . Drug use: No     Review of Systems  Cardiovascular: No chest pain. Respiratory: No SOB. Gastrointestinal: No abdominal pain.  No nausea, no vomiting.  Musculoskeletal: Positive for toe pain.  Skin: Negative for rash, abrasions, lacerations, ecchymosis. Neurological: Negative for headaches, numbness or tingling   ____________________________________________   PHYSICAL EXAM:  VITAL SIGNS: ED Triage Vitals  Enc Vitals Group     BP 08/14/17 1530 111/62     Pulse Rate 08/14/17 1530 87     Resp 08/14/17 1530 18     Temp 08/14/17 1530 98.2 F (36.8 C)     Temp src --      SpO2 08/14/17 1530 97 %     Weight 08/14/17 1532 175 lb (79.4 kg)     Height 08/14/17 1532 5\' 4"  (1.626 m)     Head Circumference --      Peak Flow --      Pain Score --      Pain Loc --      Pain Edu? --      Excl. in [place]? --      Constitutional: Alert and oriented. Well appearing and in no acute distress. Eyes: Conjunctivae are normal. PERRL. EOMI. Head: Atraumatic. ENT:      Ears:      Nose: No congestion/rhinnorhea.      Mouth/Throat: Mucous membranes are moist.  Neck: No stridor.  No cervical spine tenderness to palpation. Cardiovascular: Normal rate, regular rhythm.  Good peripheral circulation. Respiratory: Normal respiratory effort without tachypnea or retractions. Lungs CTAB. Good air entry to the bases with no decreased or absent breath sounds. Musculoskeletal: Full range of motion to all extremities. No gross deformities appreciated. Full ROM of ankle and toe. No swelling or ecchymosis.  Neurologic:  Normal speech and language. No gross focal neurologic deficits are appreciated.   Skin:  Skin is warm, dry and intact. No rash noted. Psychiatric: Mood and affect are normal. Speech and behavior are normal. Patient exhibits appropriate insight and judgement.   ____________________________________________   LABS (all labs ordered are listed, but only abnormal results are displayed)  Labs Reviewed - No data to display ____________________________________________  EKG   ____________________________________________  RADIOLOGY Robinette Haines, personally viewed and evaluated these images (plain radiographs) as part of my medical decision making, as well as reviewing the written report by the radiologist.  Dg Ankle Complete Left  Result Date: 08/14/2017 CLINICAL DATA:  Pain following fall EXAM: LEFT ANKLE COMPLETE - 3+ VIEW COMPARISON:  None. FINDINGS: Frontal, oblique, and lateral views obtained. There is no evident fracture or joint  effusion. Joint spaces appear unremarkable. No erosive change. There is an inferior calcaneal spur. Ankle mortise appears intact. IMPRESSION: Inferior calcaneal spur. No evident fracture or appreciable joint space narrowing. Ankle mortise appears intact. Electronically Signed   By: Lowella Grip III M.D.   On: 08/14/2017 15:57   Ct Head Wo Contrast  Result Date: 08/14/2017 CLINICAL DATA:  Head injury after fall today. EXAM: CT HEAD WITHOUT CONTRAST TECHNIQUE: Contiguous axial images were obtained from the base of the skull through the vertex without intravenous contrast. COMPARISON:  None. FINDINGS: Brain: No evidence of acute infarction, hemorrhage, hydrocephalus, extra-axial collection or mass lesion/mass effect. Vascular: No hyperdense vessel or unexpected calcification. Skull: Normal. Negative for fracture or focal lesion. Sinuses/Orbits: No acute finding. Other: None. IMPRESSION: Normal head CT. Electronically Signed   By: Marijo Conception, M.D.   On: 08/14/2017 16:08     ____________________________________________    PROCEDURES  Procedure(s) performed:    Procedures    Medications  traMADol (ULTRAM) tablet 50 mg (50 mg Oral Given 08/14/17 1754)     ____________________________________________   INITIAL IMPRESSION / ASSESSMENT AND PLAN / ED COURSE  Pertinent labs & imaging results that were available during my care of the patient were reviewed by me and considered in my medical decision making (see chart for details).  Review of the [place] CSRS was performed in accordance of the [place] prior to dispensing any controlled drugs.     Patient presented to emergency department for evaluation after fall.  Signs and exam are reassuring.  CT negative for acute abnormalities.  Ankle x-ray negative for acute bony abnormalities.  Patient was given tramadol for headache.  She is requesting to go home.  Patient will be discharged home with prescriptions for Tylenol. Patient is to follow up with PCP as directed. Patient is given ED precautions to return to the ED for any worsening or new symptoms.     ____________________________________________  FINAL CLINICAL IMPRESSION(S) / ED DIAGNOSES  Final diagnoses:  Fall, initial encounter  Injury of head, initial encounter      NEW MEDICATIONS STARTED DURING THIS VISIT:  ED Discharge Orders        Ordered    acetaminophen (TYLENOL) 500 MG tablet  Every 6 hours PRN     08/14/17 1753          This chart was dictated using voice recognition software/Dragon. Despite best efforts to proofread, errors can occur which can change the meaning. Any change was purely unintentional.    Laban Emperor, PA-C 08/14/17 [place], [place], MD 08/15/17 [phone]

## 2017-08-14 NOTE — ED Notes (Signed)
Pts supervisor called and does not require a urine drug screen or breath analysis for WC workup.

## 2017-08-14 NOTE — ED Triage Notes (Signed)
PT arrived via pov from work after mechanical fall that resulted in pt hitting her head. No loc. Pt does complain of left big toe, left ankle, and headache. Pt does take xarelto.

## 2017-08-21 ENCOUNTER — Ambulatory Visit: Payer: PRIVATE HEALTH INSURANCE | Admitting: Podiatry

## 2017-08-21 ENCOUNTER — Encounter: Payer: Self-pay | Admitting: Podiatry

## 2017-08-21 DIAGNOSIS — L603 Nail dystrophy: Secondary | ICD-10-CM | POA: Diagnosis not present

## 2017-08-21 DIAGNOSIS — B351 Tinea unguium: Secondary | ICD-10-CM | POA: Diagnosis not present

## 2017-08-26 NOTE — Progress Notes (Signed)
   Subjective: 52 year old female presenting today with a chief complaint of fungal nails of bilateral feet. She completed laser treatment 6 months ago and states the nails have still not cleared up. There are no modifying factors noted. Patient is here for further evaluation and treatment.   Past Medical History:  Diagnosis Date  . Afib ([place])   . Cancer ([place])    colon  . Colon cancer ([place])    2015, 10 inches removed  . Depression   . Headache   . Heart murmur   . Hyperlipemia   . Hypertension   . Insomnia   . PONV (postoperative nausea and vomiting)     Objective: Physical Exam General: The patient is alert and oriented x3 in no acute distress.  Dermatology: Hyperkeratotic, discolored, thickened, onychodystrophy of nails noted bilaterally.  Skin is warm, dry and supple bilateral lower extremities. Negative for open lesions or macerations.  Vascular: Palpable pedal pulses bilaterally. No edema or erythema noted. Capillary refill within normal limits.  Neurological: Epicritic and protective threshold grossly intact bilaterally.   Musculoskeletal Exam: Range of motion within normal limits to all pedal and ankle joints bilateral. Muscle strength 5/5 in all groups bilateral.   Assessment: #1 onychomycosis nails 1-5 bilateral   Plan of Care:  #1 Patient was evaluated. #2 Nail biopsy taken today. If positive, we will call in prescription for Lamisil 250 mg #90.  #3 Patient has tried and failed all other treatment modalities.  #4 If oral Lamisil is not effective or if biopsy is negative, patient may want to consider temporary nail avulsions.  #5 Return to clinic as needed.    Edrick Kins, DPM [facility]  Dr. Edrick Kins, [address]                                        [place], [place] [address]                Office [phone]  Fax [phone]

## 2017-09-11 ENCOUNTER — Encounter: Payer: Self-pay | Admitting: Podiatry

## 2017-09-12 ENCOUNTER — Other Ambulatory Visit: Payer: Self-pay | Admitting: Podiatry

## 2017-09-12 MED ORDER — TERBINAFINE HCL 250 MG PO TABS: 250.0000 mg | ORAL_TABLET | Freq: Every day | ORAL | 0 refills | Status: DC

## 2017-09-12 NOTE — Progress Notes (Signed)
Called patient informing her of fungal nail biopsy results. Lamisil 250mg  #90 sent in to pharmacy.   RTC PRN.   Edrick Kins, DPM [facility]  Dr. Edrick Kins, DPM    [address], [place] [address]                Office [phone]  Fax [phone]

## 2017-11-20 ENCOUNTER — Other Ambulatory Visit: Payer: Self-pay | Admitting: Obstetrics and Gynecology

## 2017-11-20 DIAGNOSIS — Z1231 Encounter for screening mammogram for malignant neoplasm of breast: Secondary | ICD-10-CM

## 2017-12-05 ENCOUNTER — Ambulatory Visit
Admission: RE | Admit: 2017-12-05 | Discharge: 2017-12-05 | Disposition: A | Discharge status: Home / Self Care | Payer: PRIVATE HEALTH INSURANCE | Source: Ambulatory Visit | Attending: Obstetrics and Gynecology | Admitting: Obstetrics and Gynecology

## 2017-12-05 ENCOUNTER — Encounter: Payer: Self-pay | Admitting: Cardiology

## 2017-12-05 DIAGNOSIS — Z1231 Encounter for screening mammogram for malignant neoplasm of breast: Secondary | ICD-10-CM | POA: Diagnosis present

## 2017-12-15 ENCOUNTER — Other Ambulatory Visit: Payer: Self-pay | Admitting: Cardiology

## 2017-12-31 ENCOUNTER — Encounter: Payer: Self-pay | Admitting: *Deleted

## 2018-01-04 ENCOUNTER — Encounter: Payer: Self-pay | Admitting: Cardiology

## 2018-01-04 ENCOUNTER — Ambulatory Visit: Payer: PRIVATE HEALTH INSURANCE | Admitting: Cardiology

## 2018-01-04 VITALS — BP 108/70 | HR 60 | Ht 64.0 in | Wt 190.0 lb

## 2018-01-04 DIAGNOSIS — I5022 Chronic systolic (congestive) heart failure: Secondary | ICD-10-CM | POA: Diagnosis not present

## 2018-01-04 DIAGNOSIS — I480 Paroxysmal atrial fibrillation: Secondary | ICD-10-CM | POA: Diagnosis not present

## 2018-01-04 DIAGNOSIS — I10 Essential (primary) hypertension: Secondary | ICD-10-CM

## 2018-01-04 NOTE — Progress Notes (Signed)
Electrophysiology Office Note   Date:  01/04/2018   ID:  Jessica, Moody July 27, 1965, MRN [id number]  PCP:  Kirk Ruths, MD  Cardiologist:   Primary Electrophysiologist:  Debbe Crumble Meredith Leeds, MD    No chief complaint on file.    History of Present Illness: Jessica Moody is a 52 y.o. female who is being seen today for the evaluation of atrial fibrillaiton at the request of Kirk Ruths, MD. She has a history of colon cancer, hyperlipidemia, and hypertension. Presents today as follow up of AF and new onset mildly decreased LVEF.  She woke from sleep with palpitations and was found to be in AF. Had fatigue and SOB. Went to PCP and was found to be in AF. Converted 2 days later. TTE showed EF 40-45%. Myoview was low risk.  Today, denies symptoms of palpitations, chest pain, shortness of breath, orthopnea, PND, lower extremity edema, claudication, dizziness, presyncope, syncope, bleeding, or neurologic sequela. The patient is tolerating medications without difficulties.  Overall she is doing well.  She has palpitations once every few months.  They last for up to 12 hours but have not lasted any longer.  She is comfortable with the therapy for atrial fibrillation.  Past Medical History:  Diagnosis Date  . Afib ([place])   . Cancer ([place])    colon  . Colon cancer ([place])    2015, 10 inches removed  . Depression   . Headache   . Heart murmur   . Hyperlipemia   . Hypertension   . Insomnia   . PONV (postoperative nausea and vomiting)    Past Surgical History:  Procedure Laterality Date  . APPENDECTOMY    . CESAREAN SECTION N/A    C-Section X 2  . COLON SURGERY    . COLONOSCOPY WITH PROPOFOL N/A 11/30/2014   Procedure: COLONOSCOPY WITH PROPOFOL;  Surgeon: Josefine Class, MD;  Location: [facility] ENDOSCOPY;  Service: Endoscopy;  Laterality: N/A;  . GASTRIC BYPASS  2016  . INCISIONAL HERNIA REPAIR Right   . LAPAROSCOPIC GASTRIC RESTRICTIVE DUODENAL PROCEDURE (DUODENAL  SWITCH) N/A 06/30/2014   Procedure: LAPAROSCOPIC DUODENAL SWITCH WTIH BILIARY PANCREATIC DIVERSION ;  Surgeon: [name], MD;  Location: [facility] ORS;  Service: General;  Laterality: N/A;  . TONSILLECTOMY       Current Outpatient Medications  Medication Sig Dispense Refill  . acetaminophen (TYLENOL) 500 MG tablet Take 1 tablet (500 mg total) by mouth every 6 (six) hours as needed. 30 tablet 0  . amoxicillin-clavulanate (AUGMENTIN) 875-125 MG tablet TK 1 T PO  BID  0  . Calcium-Magnesium-Vitamin D (CALCIUM 1200+D3 PO) Take 2 capsules by mouth 2 (two) times daily.     Marland Kitchen CREON 36000 units CPEP capsule TK 4 CS PO WITH MEALS AND 2 CS WITH SNACKS  6  . estradiol (ESTRACE) 0.1 MG/GM vaginal cream Place 1 Applicatorful vaginally 2 (two) times a week.    . losartan (COZAAR) 25 MG tablet TAKE 1 TABLET(25 MG) BY MOUTH DAILY 90 tablet 3  . Multiple Vitamin (MULTIVITAMIN) tablet Take 1 tablet by mouth daily.    . NONFORMULARY OR COMPOUNDED ITEM Apply 1-2 g topically daily. [employer] Nail lacquer: Fluconazole 2%, Terbinafine 1%, DMSO 120 each 11  . terbinafine (LAMISIL) 250 MG tablet Take 1 tablet (250 mg total) by mouth daily. 90 tablet 0  . XARELTO 20 MG TABS tablet TAKE 1 TABLET(20 MG) BY MOUTH DAILY WITH SUPPER 30 tablet 2  . metoprolol succinate (TOPROL-XL) 50  MG 24 hr tablet Take 50 mg by mouth daily.     No current facility-administered medications for this visit.     Allergies:   Oxycodone; Poison ivy extract; Aloe vera; Diclofenac sodium; Nsaids; and Prednisone   Social History:  The patient  reports that she quit smoking about 19 years ago. Her smoking use included cigarettes. She smoked 1.00 pack per day. She has never used smokeless tobacco. She reports that she does not drink alcohol or use drugs.   Family History:  The patient's family history includes Breast cancer in her paternal aunt; Colon cancer in her cousin; Diabetes in her maternal aunt; Diabetes type II in her mother; Fuch's  dystrophy in her paternal aunt; Glaucoma in her father; Heart disease in her father; Heart failure in her mother; Hypertension in her father and mother; Kidney disease in her mother; Lung cancer in her paternal uncle; Stroke in her father; Uterine cancer in her maternal aunt.   ROS:  Please see the history of present illness.   Otherwise, review of systems is positive for chills, fatigue, palpitations, abdominal pain, depression, anxiety, dizziness.   All other systems are reviewed and negative.   PHYSICAL EXAM: VS:  BP 108/70   Pulse 60   Ht 5\' 4"  (1.626 m)   Wt 190 lb (86.2 kg)   LMP 09/29/2015   SpO2 97%   BMI 32.61 kg/m  , BMI Body mass index is 32.61 kg/m. GEN: Well nourished, well developed, in no acute distress  HEENT: normal  Neck: no JVD, carotid bruits, or masses Cardiac: RRR; no murmurs, rubs, or gallops,no edema  Respiratory:  clear to auscultation bilaterally, normal work of breathing GI: soft, nontender, nondistended, + BS MS: no deformity or atrophy  Skin: warm and dry Neuro:  Strength and sensation are intact Psych: euthymic mood, full affect  EKG:  EKG is ordered today. Personal review of the ekg ordered shows sinus rhythm, rate 60  Recent Labs: No results found for requested labs within last 8760 hours.    Lipid Panel  No results found for: CHOL, TRIG, HDL, CHOLHDL, VLDL, LDLCALC, LDLDIRECT   Wt Readings from Last 3 Encounters:  01/04/18 190 lb (86.2 kg)  08/14/17 175 lb (79.4 kg)  07/02/17 179 lb (81.2 kg)      Other studies Reviewed: Additional studies/ records that were reviewed today include: TTE 09/20/16 - Left ventricle: The cavity size was mildly dilated. Wall   thickness was normal. Systolic function was mildly to moderately   reduced. The estimated ejection fraction was in the range of 40%   to 45%. Diffuse hypokinesis. - Aortic valve: There was mild regurgitation. - Right ventricle: The cavity size was mildly dilated. Wall   thickness was  normal.  Myoview 10/03/16  The left ventricular ejection fraction is mildly decreased (45-54%).  Nuclear stress EF: 47%.  There was no ST segment deviation noted during stress.  Defect 1: There is a small defect of moderate severity present in the apex location.  This is a low risk study.   Low risk stress nuclear study with very mild apical ischemia; EF 47 with global hypokinesis and mild LVE.   ASSESSMENT AND PLAN:  1.   Paroxysmal atrial fibrillation: Currently on Toprol-XL and Xarelto.  Has had minimal atrial fibrillation since last being seen.  She is comfortable with her therapy thus far.  No changes.    This patients CHA2DS2-VASc Score and unadjusted Ischemic Stroke Rate (% per year) is equal to 2.2 %  stroke rate/year from a score of 2  Above score calculated as 1 point each if present [CHF, HTN, DM, Vascular=MI/PAD/Aortic Plaque, Age if 65-74, or Female] Above score calculated as 2 points each if present [Age > 75, or Stroke/TIA/TE]  2. Hypertension: Well-controlled.  No changes.  3. Systolic heart failure, chronicity unknown: EF 40 to 45%.  On Toprol-XL and losartan.  Asymptomatic. Current medicines are reviewed at length with the patient today.   The patient does not have concerns regarding her medicines.  The following changes were made today: None  Labs/ tests ordered today include:  Orders Placed This Encounter  Procedures  . EKG 12-Lead   Disposition:   FU with Keagen Heinlen 12 months  Signed, Avis Tirone Meredith Leeds, MD  01/04/2018 2:45 PM     [place] [address] [address] [phone] (office) [phone] (fax)

## 2018-01-22 MED ORDER — METOPROLOL SUCCINATE ER 50 MG PO TB24: 50.0000 mg | ORAL_TABLET | Freq: Every day | ORAL | 2 refills | Status: DC

## 2018-01-25 MED ORDER — METOPROLOL SUCCINATE ER 50 MG PO TB24: 50.0000 mg | ORAL_TABLET | Freq: Every day | ORAL | 3 refills | Status: DC

## 2018-01-25 NOTE — Telephone Encounter (Signed)
Pt's medication was sent to pt's pharmacy as requested. Confirmation received.  °

## 2018-02-27 ENCOUNTER — Telehealth: Payer: Self-pay | Admitting: Cardiology

## 2018-02-27 NOTE — Telephone Encounter (Signed)
Dr. Shonna Chock pt. See MY CHART MESSAGE FROM YESTERDAY

## 2018-02-27 NOTE — Telephone Encounter (Signed)
New Message:    Patient calling about a new medication that her OBY gave her. Patient has not started. Patient has some concerns about if it is ok to take.

## 2018-03-25 ENCOUNTER — Other Ambulatory Visit: Payer: Self-pay | Admitting: Cardiology

## 2018-03-26 NOTE — Telephone Encounter (Signed)
SCr 0.6 from Nov 2019 in [place] at [facility]. Age 53, wt 86.2kg, CrCl > 183mL/min.

## 2018-07-05 ENCOUNTER — Telehealth: Payer: Self-pay | Admitting: *Deleted

## 2018-07-05 NOTE — Telephone Encounter (Signed)
   [facility] Pre-operative Risk Assessment    Request for surgical clearance:  1. What type of surgery is being performed? LEFT ROTATOR CUFF REPAIR  2. When is this surgery scheduled? 07/12/18  3. What type of clearance is required (medical clearance vs. Pharmacy clearance to hold med vs. Both)? BOTH  4. Are there any medications that need to be held prior to surgery and how long? XARELTO 3 DAYS PRIOR  5. Practice name and name of physician performing surgery? [facility]; DR. Jeneen Rinks [place]  6. What is your office phone number [phone] EXT [address]   7.   What is your office fax number [phone]  8.   Anesthesia type (None, local, MAC, general) ? GENERAL   Julaine Hua 07/05/2018, 2:39 PM  _________________________________________________________________   (provider comments below)

## 2018-07-05 NOTE — Telephone Encounter (Signed)
Pt takes Xarelto for afib with CHADS2VASc score of 3 (sex, CHF, HTN). Renal function is normal. Ok to hold Xarelto for 3 days as requested prior to procedure.

## 2018-07-05 NOTE — Telephone Encounter (Signed)
   Primary Cardiologist: Dr. Curt Bears  Chart reviewed as part of pre-operative protocol coverage. Patient was contacted 07/05/2018 in reference to pre-operative risk assessment for pending surgery as outlined below.  Jessica Moody was last seen on 12/2017 by Dr. Curt Bears. H/o HTN, HLD, colon CA, AF and decreased EF (40-45%) with low risk nuc with very mild apical ischemia felt to represent NICM by Dr. Curt Bears. RCRI 0.9% indicating low risk of CV complications. Spoke with patient who affirms she has been doing very well without any new concerns or cardiac symptoms. Therefore, based on ACC/AHA guidelines, the patient would be at acceptable risk for the planned procedure without further cardiovascular testing.   Per PharmD review, "Ok to hold Xarelto for 3 days as requested prior to procedure."  I will route this recommendation to the requesting party via [place] fax function and remove from pre-op pool.  Please call with questions.  Charlie Pitter, PA-C 07/05/2018, 3:28 PM

## 2018-08-24 ENCOUNTER — Other Ambulatory Visit: Payer: Self-pay | Admitting: Cardiology

## 2018-09-27 ENCOUNTER — Other Ambulatory Visit: Payer: Self-pay | Admitting: Cardiology

## 2018-10-01 NOTE — Telephone Encounter (Signed)
Age 54, weight 86kg, SCr 0.6 on 12/06/17 in [place], CrCl 173mL/min, last OV Dec 20109, afib indication

## 2018-10-19 ENCOUNTER — Other Ambulatory Visit: Payer: Self-pay | Admitting: Cardiology

## 2019-01-07 ENCOUNTER — Ambulatory Visit: Payer: PRIVATE HEALTH INSURANCE | Admitting: Cardiology

## 2019-01-07 ENCOUNTER — Encounter: Payer: Self-pay | Admitting: Cardiology

## 2019-01-07 ENCOUNTER — Other Ambulatory Visit: Payer: Self-pay

## 2019-01-07 VITALS — BP 120/72 | HR 69 | Ht 64.0 in | Wt 186.6 lb

## 2019-01-07 DIAGNOSIS — I480 Paroxysmal atrial fibrillation: Secondary | ICD-10-CM

## 2019-01-07 NOTE — Progress Notes (Signed)
Electrophysiology Office Note   Date:  01/07/2019   ID:  Jessica Moody, Jessica Moody, MRN [id number]  PCP:  Kirk Ruths, MD  Cardiologist:   Primary Electrophysiologist:  Adeena Bernabe Meredith Leeds, MD    No chief complaint on file.    History of Present Illness: Jessica Moody is a 53 y.o. female who is being seen today for the evaluation of atrial fibrillaiton at the request of Kirk Ruths, MD. She has a history of colon cancer, hyperlipidemia, and hypertension. Presents today as follow up of AF and new onset mildly decreased LVEF.  She woke from sleep with palpitations and was found to be in AF. Had fatigue and SOB. Went to PCP and was found to be in AF. Converted 2 days later. TTE showed EF 40-45%. Myoview was low risk.  Today, denies symptoms of palpitations, chest pain, shortness of breath, orthopnea, PND, lower extremity edema, claudication, dizziness, presyncope, syncope, bleeding, or neurologic sequela. The patient is tolerating medications without difficulties.  Overall she is doing well.  No chest pain or shortness of breath.  She does continue to have rare episodes of atrial fibrillation once every few months that last up to 8 hours.  Otherwise she feels well with no complaints.  Past Medical History:  Diagnosis Date  . Afib ([place])   . Cancer ([place])    colon  . Colon cancer ([place])    2015, 10 inches removed  . Depression   . Headache   . Heart murmur   . Hyperlipemia   . Hypertension   . Insomnia   . PONV (postoperative nausea and vomiting)    Past Surgical History:  Procedure Laterality Date  . APPENDECTOMY    . CESAREAN SECTION N/A    C-Section X 2  . COLON SURGERY    . COLONOSCOPY WITH PROPOFOL N/A 11/30/2014   Procedure: COLONOSCOPY WITH PROPOFOL;  Surgeon: Josefine Class, MD;  Location: [facility] ENDOSCOPY;  Service: Endoscopy;  Laterality: N/A;  . GASTRIC BYPASS  2016  . INCISIONAL HERNIA REPAIR Right   . LAPAROSCOPIC GASTRIC RESTRICTIVE DUODENAL  PROCEDURE (DUODENAL SWITCH) N/A 06/30/2014   Procedure: LAPAROSCOPIC DUODENAL SWITCH WTIH BILIARY PANCREATIC DIVERSION ;  Surgeon: [name], MD;  Location: [facility] ORS;  Service: General;  Laterality: N/A;  . TONSILLECTOMY       Current Outpatient Medications  Medication Sig Dispense Refill  . acetaminophen (TYLENOL) 500 MG tablet Take 1 tablet (500 mg total) by mouth every 6 (six) hours as needed. 30 tablet 0  . amoxicillin-clavulanate (AUGMENTIN) 875-125 MG tablet TK 1 T PO  BID  0  . Calcium-Magnesium-Vitamin D (CALCIUM 1200+D3 PO) Take 2 capsules by mouth 2 (two) times daily.     Marland Kitchen CREON 36000 units CPEP capsule TK 4 CS PO WITH MEALS AND 2 CS WITH SNACKS  6  . estradiol (ESTRACE) 0.1 MG/GM vaginal cream Place 1 Applicatorful vaginally 2 (two) times a week.    . losartan (COZAAR) 25 MG tablet Take 1 tablet (25 mg total) by mouth daily. Please make annual appt in December for future refills. Thank you 90 tablet 1  . metoprolol succinate (TOPROL-XL) 50 MG 24 hr tablet TAKE 1 TABLET BY MOUTH EVERY DAY 90 tablet 3  . Multiple Vitamin (MULTIVITAMIN) tablet Take 1 tablet by mouth daily.    . NONFORMULARY OR COMPOUNDED ITEM Apply 1-2 g topically daily. [employer] Nail lacquer: Fluconazole 2%, Terbinafine 1%, DMSO 120 each 11  . terbinafine (LAMISIL) 250 MG  tablet Take 1 tablet (250 mg total) by mouth daily. 90 tablet 0  . XARELTO 20 MG TABS tablet TAKE 1 TABLET(20 MG) BY MOUTH DAILY WITH SUPPER 30 tablet 5   No current facility-administered medications for this visit.    Allergies:   Oxycodone, Poison ivy extract, Aloe vera, Diclofenac sodium, Nsaids, Prednisone, and Sulfa antibiotics   Social History:  The patient  reports that she quit smoking about 20 years ago. Her smoking use included cigarettes. She smoked 1.00 pack per day. She has never used smokeless tobacco. She reports that she does not drink alcohol or use drugs.   Family History:  The patient's family history includes Breast  cancer in her paternal aunt; Colon cancer in her cousin; Diabetes in her maternal aunt; Diabetes type II in her mother; Fuch's dystrophy in her paternal aunt; Glaucoma in her father; Heart disease in her father; Heart failure in her mother; Hypertension in her father and mother; Kidney disease in her mother; Lung cancer in her paternal uncle; Stroke in her father; Uterine cancer in her maternal aunt.   ROS:  Please see the history of present illness.   Otherwise, review of systems is positive for none.   All other systems are reviewed and negative.   PHYSICAL EXAM: VS:  BP 120/72   Pulse 69   Ht 5\' 4"  (1.626 m)   Wt 186 lb 9.6 oz (84.6 kg)   LMP 09/29/2015   SpO2 98%   BMI 32.03 kg/m  , BMI Body mass index is 32.03 kg/m. GEN: Well nourished, well developed, in no acute distress  HEENT: normal  Neck: no JVD, carotid bruits, or masses Cardiac: RRR; no murmurs, rubs, or gallops,no edema  Respiratory:  clear to auscultation bilaterally, normal work of breathing GI: soft, nontender, nondistended, + BS MS: no deformity or atrophy  Skin: warm and dry Neuro:  Strength and sensation are intact Psych: euthymic mood, full affect  EKG:  EKG is ordered today. Personal review of the ekg ordered shows sinus rhythm, rate 69  Recent Labs: No results found for requested labs within last 8760 hours.    Lipid Panel  No results found for: CHOL, TRIG, HDL, CHOLHDL, VLDL, LDLCALC, LDLDIRECT   Wt Readings from Last 3 Encounters:  01/07/19 186 lb 9.6 oz (84.6 kg)  01/04/18 190 lb (86.2 kg)  08/14/17 175 lb (79.4 kg)      Other studies Reviewed: Additional studies/ records that were reviewed today include: TTE 09/20/16 - Left ventricle: The cavity size was mildly dilated. Wall   thickness was normal. Systolic function was mildly to moderately   reduced. The estimated ejection fraction was in the range of 40%   to 45%. Diffuse hypokinesis. - Aortic valve: There was mild regurgitation. - Right  ventricle: The cavity size was mildly dilated. Wall   thickness was normal.  Myoview 10/03/16  The left ventricular ejection fraction is mildly decreased (45-54%).  Nuclear stress EF: 47%.  There was no ST segment deviation noted during stress.  Defect 1: There is a small defect of moderate severity present in the apex location.  This is a low risk study.   Low risk stress nuclear study with very mild apical ischemia; EF 47 with global hypokinesis and mild LVE.   ASSESSMENT AND PLAN:  1.   Paroxysmal atrial fibrillation: Currently on Toprol-XL and Xarelto.  CHA2DS2-VASc of 2.  She is currently feeling well with rare episodes of atrial fibrillation.  No changes.  2. Hypertension: Currently  well controlled.  3.  Chronic systolic heart failure: Ejection fraction 40 to 45%.  Currently on Toprol-XL and losartan.  Patient is asymptomatic.  I have offered her repeat echo to reassess her ejection fraction.  If her ejection fraction is normalized, she would be able to come off of her Xarelto.  She Gladies Sofranko think about this and let us know.  Current medicines are reviewed at length with the patient today.   The patient does not have concerns regarding her medicines.  The following changes were made today: None  Labs/ tests ordered today include:  Orders Placed This Encounter  Procedures  . EKG 12-Lead   Disposition:   FU with Eleina Jergens 12 months  Signed, Belen Zwahlen Meredith Leeds, MD  01/07/2019 8:40 AM     [facility] [address] [place] [address] [phone] (office) [phone] (fax)

## 2019-02-21 ENCOUNTER — Other Ambulatory Visit: Payer: Self-pay | Admitting: Cardiology

## 2019-03-22 ENCOUNTER — Other Ambulatory Visit: Payer: Self-pay | Admitting: Cardiology

## 2019-03-24 NOTE — Telephone Encounter (Signed)
Pt last saw Dr Curt Bears 01/07/19, last labs 10/07/18 Creat 0.7, age 54, weight 84.6kg, CrCl 122.7, based on CrCl pt is on appropriate dosage of Xarelto 20mg  QD.  Will refill rx.

## 2019-05-21 ENCOUNTER — Telehealth: Payer: Self-pay | Admitting: Cardiology

## 2019-05-21 DIAGNOSIS — I480 Paroxysmal atrial fibrillation: Secondary | ICD-10-CM

## 2019-05-21 DIAGNOSIS — I5022 Chronic systolic (congestive) heart failure: Secondary | ICD-10-CM

## 2019-05-21 NOTE — Telephone Encounter (Signed)
Patient sent an appointment request for an ultrasound that was discussed at her last appointment with Dr. Curt Bears, but there is no order to schedule from.

## 2019-05-21 NOTE — Telephone Encounter (Signed)
Left message asking pt to confirm that she wanted to proceed with echo scheduling

## 2019-05-26 NOTE — Telephone Encounter (Signed)
Pt sent mychart message requesting echo. Responded informing pt that office will be in touch to arrange testing.

## 2019-06-03 ENCOUNTER — Other Ambulatory Visit: Payer: Self-pay | Admitting: Obstetrics and Gynecology

## 2019-06-03 DIAGNOSIS — Z1231 Encounter for screening mammogram for malignant neoplasm of breast: Secondary | ICD-10-CM

## 2019-06-17 ENCOUNTER — Other Ambulatory Visit: Payer: Self-pay

## 2019-06-17 ENCOUNTER — Ambulatory Visit (HOSPITAL_COMMUNITY): Payer: PRIVATE HEALTH INSURANCE | Attending: Cardiology

## 2019-06-17 DIAGNOSIS — I480 Paroxysmal atrial fibrillation: Secondary | ICD-10-CM | POA: Diagnosis not present

## 2019-06-17 DIAGNOSIS — I5022 Chronic systolic (congestive) heart failure: Secondary | ICD-10-CM | POA: Insufficient documentation

## 2019-06-25 NOTE — Telephone Encounter (Signed)
Communicated with the patient via mychart messages.

## 2019-06-25 NOTE — Telephone Encounter (Signed)
-----   Message from Will Meredith Leeds, MD sent at 06/20/2019  4:16 PM EDT ----- LVEF low nomral. Will discuss anticoagulation at next visit.

## 2019-07-16 ENCOUNTER — Ambulatory Visit
Admission: RE | Admit: 2019-07-16 | Discharge: 2019-07-16 | Disposition: A | Discharge status: Home / Self Care | Payer: PRIVATE HEALTH INSURANCE | Source: Ambulatory Visit | Attending: Obstetrics and Gynecology | Admitting: Obstetrics and Gynecology

## 2019-07-16 DIAGNOSIS — Z1231 Encounter for screening mammogram for malignant neoplasm of breast: Secondary | ICD-10-CM | POA: Diagnosis not present

## 2019-08-29 ENCOUNTER — Other Ambulatory Visit: Payer: Self-pay

## 2019-08-29 ENCOUNTER — Ambulatory Visit: Payer: PRIVATE HEALTH INSURANCE | Admitting: Podiatry

## 2019-08-29 DIAGNOSIS — L299 Pruritus, unspecified: Secondary | ICD-10-CM

## 2019-08-29 DIAGNOSIS — R234 Changes in skin texture: Secondary | ICD-10-CM

## 2019-08-29 DIAGNOSIS — B351 Tinea unguium: Secondary | ICD-10-CM

## 2019-08-29 DIAGNOSIS — M79675 Pain in left toe(s): Secondary | ICD-10-CM | POA: Diagnosis not present

## 2019-08-29 DIAGNOSIS — M79674 Pain in right toe(s): Secondary | ICD-10-CM

## 2019-08-29 MED ORDER — CLOTRIMAZOLE-BETAMETHASONE 1-0.05 % EX CREA: 1.0000 "application " | TOPICAL_CREAM | Freq: Two times a day (BID) | CUTANEOUS | 2 refills | Status: DC

## 2019-08-29 MED ORDER — TERBINAFINE HCL 250 MG PO TABS: 250.0000 mg | ORAL_TABLET | Freq: Every day | ORAL | 0 refills | Status: DC

## 2019-08-29 NOTE — Progress Notes (Signed)
   Subjective: 54 year old female presenting today for follow-up evaluation of onychomycosis of toenails. She has history of dystrophic thickened discolored nails to the bilateral great toes. She has completed laser treatment in the past and also took 3 months of antifungal medication with moderate relief. It seems that the best results were from the oral Lamisil. She presents today for further treatment evaluation She also states that over the last 3-4 months she has had itching of a skin lesion to the posterior aspect of the heel of the right foot. She is concerned that possibly may be a wart. She has tried OTC wart remover with no improvement of symptoms. She presents for further treatment and evaluation  Past Medical History:  Diagnosis Date  . Afib ([place])   . Cancer ([place])    colon  . Colon cancer ([place])    2015, 10 inches removed  . Depression   . Headache   . Heart murmur   . Hyperlipemia   . Hypertension   . Insomnia   . PONV (postoperative nausea and vomiting)     Objective: Physical Exam General: The patient is alert and oriented x3 in no acute distress.  Dermatology: Hyperkeratotic, discolored, thickened, onychodystrophy of nails noted bilaterally.  There is also a superficial fissure of skin with a small open wound limited to the breakdown of skin to the posterior aspect of the heel. This is the area causes itching. Concerned there may be some chronic tinea pedis noted as well. There is some diffuse hyperkeratosis of the skin around the heel.  Vascular: Palpable pedal pulses bilaterally. No edema or erythema noted. Capillary refill within normal limits.  Neurological: Epicritic and protective threshold grossly intact bilaterally.   Musculoskeletal Exam: Range of motion within normal limits to all pedal and ankle joints bilateral. Muscle strength 5/5 in all groups bilateral.   Assessment: #1 onychomycosis nails 1-5 bilateral  #2 superficial fissure of skin right posterior  heel possibly complicated by tinea pedis  Plan of Care:  #1 Patient was evaluated. #2 Refill prescription for Lamisil 250 mg #90. Patient denies any liver pathology or past medical history with liver problems or hepatic functional impairment. Patient otherwise healthy #3 prescription for Lotrisone cream applied to the posterior heel daily #4 return to clinic in 3 months. In 3 months we will order new hepatic function panel and represcribe 3 additional months of Lamisil. If the patient fails 6 months of oral Lamisil we may need to consider permanent nail avulsion procedures   Edrick Kins, DPM [facility]  Dr. Edrick Kins, [address]                                        [place], [place] [address]                Office [phone]  Fax [phone]

## 2019-09-03 ENCOUNTER — Ambulatory Visit: Payer: PRIVATE HEALTH INSURANCE | Admitting: Dermatology

## 2019-09-03 ENCOUNTER — Other Ambulatory Visit: Payer: Self-pay

## 2019-09-03 DIAGNOSIS — L280 Lichen simplex chronicus: Secondary | ICD-10-CM | POA: Diagnosis not present

## 2019-09-03 DIAGNOSIS — L820 Inflamed seborrheic keratosis: Secondary | ICD-10-CM | POA: Diagnosis not present

## 2019-09-03 MED ORDER — HALOBETASOL PROPIONATE 0.05 % EX CREA: TOPICAL_CREAM | Freq: Two times a day (BID) | CUTANEOUS | 1 refills | Status: DC | PRN

## 2019-09-03 MED ORDER — MOMETASONE FUROATE 0.1 % EX CREA: 1.0000 "application " | TOPICAL_CREAM | Freq: Two times a day (BID) | CUTANEOUS | 1 refills | Status: DC | PRN

## 2019-09-03 MED ORDER — DOXEPIN HCL 10 MG PO CAPS: 10.0000 mg | ORAL_CAPSULE | Freq: Every day | ORAL | 1 refills | Status: DC

## 2019-09-03 NOTE — Progress Notes (Signed)
   Follow-Up Visit   Subjective  Jessica Moody is a 54 y.o. female who presents for the following: Rash (Right cheek and chest. Stinging. Has been here before for this condition. Flaring since December.). Has been using [place] which isn't helping. C/O changes in texture and a severe itch off and on.  She frequently rubs/scratches area.  She also has a bump on R cheek that itches.    The following portions of the chart were reviewed this encounter and updated as appropriate:     Review of Systems: No other skin or systemic complaints except as noted in HPI or Assessment and Plan.  Objective  Well appearing patient in no apparent distress; mood and affect are within normal limits.  A focused examination was performed including head, including the scalp, face, neck, nose, ears, eyelids, and lips and face, neck, chest and back. Relevant physical exam findings are noted in the Assessment and Plan.  Objective  Neck - Anterior, chest, right cheek: Pink lichenified patch with scale at upper sternum. Right cheek, pre/postauricular, mild erythema with diffuse scale and focal lichenification around ear  Objective  right cheek: Erythematous keratotic or waxy stuck-on papule  Assessment & Plan  Lichen simplex chronicus Neck - Anterior, chest, right cheek  Discussed form of chronic dermatitis that persists due to frequent skin trauma from scratching.  Will need to stop rubbing/scratching for rash to clear.  Try cold packs.  Continue CeraVe anti itch cream Start topical Rx creams as below twice daily, Doxepin before bed  mometasone (ELOCON) 0.1 % cream - Neck - Anterior, chest, right cheek  halobetasol (ULTRAVATE) 0.05 % cream - Neck - Anterior, chest, right cheek  doxepin (SINEQUAN) 10 MG capsule - Neck - Anterior, chest, right cheek  Inflamed seborrheic keratosis of right cheek right cheek  Benign, irritated Will treat with cryotherapy on follow-up after itchy rash has  improved  Return in about 2 weeks (around 09/17/2019) for recheck [place].   I, Emelia Salisbury, CMA, am acting as scribe for Brendolyn Patty, MD.  Documentation: I have reviewed the above documentation for accuracy and completeness, and I agree with the above.  Brendolyn Patty MD

## 2019-09-03 NOTE — Patient Instructions (Addendum)
Gentle Skin Care Guide  1. Bathe no more than once a day.  2. Avoid bathing in hot water  3. Use a mild soap like Dove, Vanicream, Cetaphil, CeraVe. Can use Lever 2000 or Cetaphil antibacterial soap  4. Use soap only where you need it. On most days, use it under your arms, between your legs, and on your feet. Let the water rinse other areas unless visibly dirty.  5. When you get out of the bath/shower, use a towel to gently blot your skin dry, don't rub it.  6. While your skin is still a little damp, apply a moisturizing cream such as Vanicream, CeraVe, Cetaphil, Eucerin, Sarna lotion or plain Vaseline Jelly. For hands apply Neutrogena [place] Hand Cream or Excipial Hand Cream.  7. Reapply moisturizer any time you start to itch or feel dry.  8. Sometimes using free and clear laundry detergents can be helpful. Fabric softener sheets should be avoided. Downy Free & Gentle liquid, or any liquid fabric softener that is free of dyes and perfumes, it acceptable to use  9. If your doctor has given you prescription creams you may apply moisturizers over them    Topical steroids (such as triamcinolone, fluocinolone, fluocinonide, mometasone, clobetasol, halobetasol, betamethasone, hydrocortisone) can cause thinning and lightening of the skin if they are used for too long in the same area. Your physician has selected the right strength medicine for your problem and area affected on the body. Please use your medication only as directed by your physician to prevent side effects.   Recommend using cold packs to help with itching.

## 2019-09-15 ENCOUNTER — Other Ambulatory Visit: Payer: Self-pay

## 2019-09-15 ENCOUNTER — Ambulatory Visit: Payer: PRIVATE HEALTH INSURANCE | Admitting: Dermatology

## 2019-09-15 DIAGNOSIS — L280 Lichen simplex chronicus: Secondary | ICD-10-CM

## 2019-09-15 DIAGNOSIS — L820 Inflamed seborrheic keratosis: Secondary | ICD-10-CM

## 2019-09-15 NOTE — Progress Notes (Signed)
   Follow-Up Visit   Subjective  Jessica Moody is a 54 y.o. female who presents for the following: LSC (Chest, neck, right cheek. Improved with mometasone cream and halobetasol cream. Doxepin was not approved by insurance.) and ISK (right cheek, treat today if [place] improved.). No longer itching.   The following portions of the chart were reviewed this encounter and updated as appropriate:      Review of Systems:  No other skin or systemic complaints except as noted in HPI or Assessment and Plan.  Objective  Well appearing patient in no apparent distress; mood and affect are within normal limits.  A focused examination was performed including face. Relevant physical exam findings are noted in the Assessment and Plan.  Objective  Chest, Neck: Mild erythema/lichenification on sternal notch, right infra and post auricular neck.  Objective  Right Zygoma: Erythematous keratotic or waxy stuck-on papule   Assessment & Plan  Lichen simplex chronicus Chest, Neck  Improved. Decrease mometasone cream to QD x 2 weeks, then d/c.  If still itchy after the 2 weeks, patient to switch to [place] Ointment. d/c halobetasol cream and only use prn to severe flares.  Topical steroids (such as triamcinolone, fluocinolone, fluocinonide, mometasone, clobetasol, halobetasol, betamethasone, hydrocortisone) can cause thinning and lightening of the skin if they are used for too long in the same area. Your physician has selected the right strength medicine for your problem and area affected on the body. Please use your medication only as directed by your physician to prevent side effects.    Other Related Medications mometasone (ELOCON) 0.1 % cream halobetasol (ULTRAVATE) 0.05 % cream  Inflamed seborrheic keratosis Right Zygoma  May need another treatment to clear.  Recommend photoprotection while healing and daily broad spectrum sunscreen SPF 30+ once healed. Prior to procedure, discussed risks of  blister formation, small wound, skin dyspigmentation, or rare scar following cryotherapy.    Destruction of lesion - Right Zygoma  Destruction method: cryotherapy   Informed consent: discussed and consent obtained   Lesion destroyed using liquid nitrogen: Yes   Region frozen until ice ball extended beyond lesion: Yes   Outcome: patient tolerated procedure well with no complications   Post-procedure details: wound care instructions given    Return in about 2 months (around 11/15/2019) for TBSE, recheck ISK.   I, Jamesetta Orleans, CMA, am acting as scribe for Brendolyn Patty, MD .  Documentation: I have reviewed the above documentation for accuracy and completeness, and I agree with the above.  Brendolyn Patty MD

## 2019-09-15 NOTE — Patient Instructions (Addendum)
Cryotherapy Aftercare  . Wash gently with soap and water everyday.   Marland Kitchen Apply Vaseline and Band-Aid daily until healed.   Topical steroids (such as triamcinolone, fluocinolone, fluocinonide, mometasone, clobetasol, halobetasol, betamethasone, hydrocortisone) can cause thinning and lightening of the skin if they are used for too long in the same area. Your physician has selected the right strength medicine for your problem and area affected on the body. Please use your medication only as directed by your physician to prevent side effects.    Stop using halobetasol cream. Use mometasone cream once a day for 2 weeks. If still itchy, switch to [place] Ointment.   Gentle Skin Care Guide  1. Bathe no more than once a day.  2. Avoid bathing in hot water  3. Use a mild soap like Dove, Vanicream, Cetaphil, CeraVe. Can use Lever 2000 or Cetaphil antibacterial soap  4. Use soap only where you need it. On most days, use it under your arms, between your legs, and on your feet. Let the water rinse other areas unless visibly dirty.  5. When you get out of the bath/shower, use a towel to gently blot your skin dry, don't rub it.  6. While your skin is still a little damp, apply a moisturizing cream such as Vanicream, CeraVe, Cetaphil, Eucerin, Sarna lotion or plain Vaseline Jelly. For hands apply Neutrogena [place] Hand Cream or Excipial Hand Cream.  7. Reapply moisturizer any time you start to itch or feel dry.  8. Sometimes using free and clear laundry detergents can be helpful. Fabric softener sheets should be avoided. Downy Free & Gentle liquid, or any liquid fabric softener that is free of dyes and perfumes, it acceptable to use  9. If your doctor has given you prescription creams you may apply moisturizers over them

## 2019-10-12 ENCOUNTER — Other Ambulatory Visit: Payer: Self-pay | Admitting: Cardiology

## 2019-10-13 NOTE — Telephone Encounter (Signed)
Prescription refill request for Xarelto received.   Last office visit: 01/07/2019, [place] Weight: 84.6 kg Age: 54 yo Scr: 0.65, 07/03/2019 CrCl: 137ml/min   Prescription refill sent.

## 2019-10-14 ENCOUNTER — Other Ambulatory Visit: Payer: Self-pay | Admitting: Cardiology

## 2019-12-02 ENCOUNTER — Other Ambulatory Visit: Payer: Self-pay

## 2019-12-02 ENCOUNTER — Ambulatory Visit: Payer: PRIVATE HEALTH INSURANCE | Admitting: Podiatry

## 2019-12-02 ENCOUNTER — Ambulatory Visit (INDEPENDENT_AMBULATORY_CARE_PROVIDER_SITE_OTHER): Payer: PRIVATE HEALTH INSURANCE | Admitting: Dermatology

## 2019-12-02 DIAGNOSIS — Z1283 Encounter for screening for malignant neoplasm of skin: Secondary | ICD-10-CM | POA: Diagnosis not present

## 2019-12-02 DIAGNOSIS — B351 Tinea unguium: Secondary | ICD-10-CM

## 2019-12-02 DIAGNOSIS — M79674 Pain in right toe(s): Secondary | ICD-10-CM

## 2019-12-02 DIAGNOSIS — D229 Melanocytic nevi, unspecified: Secondary | ICD-10-CM | POA: Diagnosis not present

## 2019-12-02 DIAGNOSIS — L820 Inflamed seborrheic keratosis: Secondary | ICD-10-CM

## 2019-12-02 DIAGNOSIS — D220 Melanocytic nevi of lip: Secondary | ICD-10-CM

## 2019-12-02 DIAGNOSIS — L814 Other melanin hyperpigmentation: Secondary | ICD-10-CM

## 2019-12-02 DIAGNOSIS — L813 Cafe au lait spots: Secondary | ICD-10-CM

## 2019-12-02 DIAGNOSIS — Z79899 Other long term (current) drug therapy: Secondary | ICD-10-CM | POA: Diagnosis not present

## 2019-12-02 DIAGNOSIS — L578 Other skin changes due to chronic exposure to nonionizing radiation: Secondary | ICD-10-CM

## 2019-12-02 DIAGNOSIS — L821 Other seborrheic keratosis: Secondary | ICD-10-CM

## 2019-12-02 DIAGNOSIS — L853 Xerosis cutis: Secondary | ICD-10-CM

## 2019-12-02 DIAGNOSIS — M79675 Pain in left toe(s): Secondary | ICD-10-CM

## 2019-12-02 MED ORDER — TERBINAFINE HCL 250 MG PO TABS: 250.0000 mg | ORAL_TABLET | Freq: Every day | ORAL | 0 refills | Status: DC

## 2019-12-02 NOTE — Progress Notes (Signed)
   Follow-Up Visit   Subjective  Jessica Moody is a 54 y.o. female who presents for the following: TBSE and Follow-up ISK (right zygoma, improved with LN2 treatment). Still a little there. Patient is not concerned about any spots today.  The following portions of the chart were reviewed this encounter and updated as appropriate:      Review of Systems:  No other skin or systemic complaints except as noted in HPI or Assessment and Plan.  Objective  Well appearing patient in no apparent distress; mood and affect are within normal limits.  A full examination was performed including scalp, head, eyes, ears, nose, lips, neck, chest, axillae, abdomen, back, buttocks, bilateral upper extremities, bilateral lower extremities, hands, feet, fingers, toes, fingernails, and toenails. All findings within normal limits unless otherwise noted below.  Objective  Right Zygoma x 1: Waxy light brown macule, 5.67mm  Right medial shoulder x 1: 3.71mm waxy dark brown papule  Objective  Trunk, Extremities: Diffuse xerotic patches.  Objective  Right 5th toenail, bil great toenails: Thickening and yellow/white discoloration.  Objective  Right Upper Lip: 6.54mm flesh papule   Assessment & Plan   Skin cancer screening performed today.  Actinic Damage - chronic, secondary to cumulative UV radiation exposure/sun exposure over time - diffuse scaly erythematous macules with underlying dyspigmentation - Recommend daily broad spectrum sunscreen SPF 30+ to sun-exposed areas, reapply every 2 hours as needed.  - Call for new or changing lesions. Seborrheic Keratoses - Stuck-on, waxy, tan-brown papules and plaques  - Discussed benign etiology and prognosis. 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    Inflamed seborrheic keratosis (2) Right medial shoulder x 1; Right Zygoma x 1  Residual on R zygoma    Destruction of lesion - Right Zygoma x 1, Right medial shoulder x 1  Destruction method: cryotherapy   Informed consent: discussed and consent obtained   Lesion destroyed using liquid nitrogen: Yes   Region frozen until ice ball extended beyond lesion: Yes   Outcome: patient tolerated procedure well with no complications   Post-procedure details: wound care instructions given    Xerosis cutis Trunk, Extremities  Severe  Recommend mild soap and moisturizing cream 1-2 times daily.   Sample of CeraVe SA and Dove Body wash given.  Onychomycosis Right 5th toenail, bil great toenails  Continue PO terbinafine treatment as prescribed by podiatrist.  Nevus Right Upper Lip  Benign, observe.   If bothersome, discussed removal and resulting scar.  Patient may consider in the future.   Return in about 3 months (around 03/03/2020) for recheck ISK left medial shoulder.   IJamesetta Orleans, CMA, am acting as scribe for Brendolyn Patty, MD .  Documentation: I have reviewed the above documentation for accuracy and completeness, and I agree with the above.  Brendolyn Patty MD

## 2019-12-02 NOTE — Progress Notes (Signed)
   Subjective: 54 year old female presenting today for follow-up evaluation of onychomycosis of toenails. She has history of dystrophic thickened discolored nails to the bilateral great toes. She has completed laser treatment in the past and also took 3 months of antifungal medication with moderate relief. It seems that the best results were from the oral Lamisil. She presents today for further treatment evaluation She states that the tinea pedis has improved and she no longer experiences itching.   Past Medical History:  Diagnosis Date  . Afib ([place])   . Cancer ([place])    colon  . Colon cancer ([place])    2015, 10 inches removed  . Depression   . Headache   . Heart murmur   . Hyperlipemia   . Hypertension   . Insomnia   . PONV (postoperative nausea and vomiting)     Objective: Physical Exam General: The patient is alert and oriented x3 in no acute distress.  Dermatology: Hyperkeratotic, discolored, thickened, onychodystrophy of nails noted bilaterally.  There is also a superficial fissure of skin with a small open wound limited to the breakdown of skin to the posterior aspect of the heel. This is the area causes itching. Concerned there may be some chronic tinea pedis noted as well. There is some diffuse hyperkeratosis of the skin around the heel.  Vascular: Palpable pedal pulses bilaterally. No edema or erythema noted. Capillary refill within normal limits.  Neurological: Epicritic and protective threshold grossly intact bilaterally.   Musculoskeletal Exam: Range of motion within normal limits to all pedal and ankle joints bilateral. Muscle strength 5/5 in all groups bilateral.   Assessment: #1 onychomycosis nails 1-5 bilateral  #2 tinea pedis - resolved  Plan of Care:  #1 Patient was evaluated. #2 Refill prescription for Lamisil 250 mg #90.  #3 Order placed for hepatic function panel #4 RTC 6 months  Edrick Kins, DPM [facility]  Dr. Edrick Kins, [address]                                        [place], [place] [address]                Office [phone]  Fax [phone]

## 2019-12-02 NOTE — Patient Instructions (Addendum)
Cryotherapy Aftercare  . Wash gently with soap and water everyday.   Marland Kitchen Apply Vaseline and Band-Aid daily until healed.   Dry Skin Care  What causes dry skin?  Dry skin is common and results from inadequate moisture in the outer skin layers. Dry skin usually results from the excessive loss of moisture from the skin surface. This occurs due to two major factors: 1. Normally the skin's oil glands deposit a layer of oil on the skin's surface. This layer of oil prevents the loss of moisture from the skin. Exposure to soaps, cleaners, solvents, and disinfectants removes this oily film, allowing water to escape. 2. Water loss from the skin increases when the humidity is low. During winter months we spend a lot of time indoors where the air is heated. Heated air has very low humidity. This also contributes to dry skin.  A tendency for dry skin may accompany such disorders as eczema. Also, as people age, the number of functioning oil glands decreases, and the tendency toward dry skin can be a sensation of skin tightness when emerging from the shower.  How do I manage dry skin?  1. Humidify your environment. This can be accomplished by using a humidifier in your bedroom at night during winter months. 2. Bathing can actually put moisture back into your skin if done right. Take the following steps while bathing to sooth dry skin:  Avoid hot water, which only dries the skin and makes itching worse. Use warm water.  Avoid washcloths or extensive rubbing or scrubbing.  Use mild soaps like unscented Dove, Oil of Olay, Cetaphil, Basis, or CeraVe.  If you take baths rather than showers, rinse off soap residue with clean water before getting out of tub.  Once out of the shower/tub, pat dry gently with a soft towel. Leave your skin damp.  While still damp, apply any medicated ointment/cream you were prescribed to the affected areas. After you apply your medicated ointment/cream, then apply your  moisturizer to your whole body.This is the most important step in dry skin care. If this is omitted, your skin will continue to be dry.  The choice of moisturizer is also very important. In general, lotion will not provider enough moisture to severely dry skin because it is water based. You should use an ointment or cream. Moisturizers should also be unscented. Good choices include Vaseline (plain petrolatum), Aquaphor, Cetaphil, CeraVe, Vanicream, DML Forte, Aveeno moisture, or Eucerin Cream.  Bath oils can be helpful, but do not replace the application of moisturizer after the bath. In addition, they make the tub slippery causing an increased risk for falls. Therefore, we do not recommend their use.

## 2019-12-03 LAB — HEPATIC FUNCTION PANEL
ALT: 18 IU/L (ref 0–32)
AST: 21 IU/L (ref 0–40)
Albumin: 4 g/dL (ref 3.8–4.9)
Alkaline Phosphatase: 93 IU/L (ref 44–121)
Bilirubin Total: 0.3 mg/dL (ref 0.0–1.2)
Bilirubin, Direct: 0.1 mg/dL (ref 0.00–0.40)
Total Protein: 6.3 g/dL (ref 6.0–8.5)

## 2019-12-06 ENCOUNTER — Other Ambulatory Visit: Payer: Self-pay | Admitting: Podiatry

## 2019-12-08 ENCOUNTER — Encounter: Payer: Self-pay | Admitting: Podiatry

## 2019-12-08 NOTE — Telephone Encounter (Signed)
Rx already sent to pharmacy on 11/9. - Dr. Amalia Hailey

## 2019-12-08 NOTE — Telephone Encounter (Signed)
Please advise 

## 2019-12-09 ENCOUNTER — Other Ambulatory Visit: Payer: Self-pay | Admitting: Podiatry

## 2019-12-09 MED ORDER — TERBINAFINE HCL 250 MG PO TABS: 250.0000 mg | ORAL_TABLET | Freq: Every day | ORAL | 0 refills | Status: DC

## 2020-01-07 ENCOUNTER — Other Ambulatory Visit: Payer: Self-pay | Admitting: Obstetrics and Gynecology

## 2020-01-07 DIAGNOSIS — Z1231 Encounter for screening mammogram for malignant neoplasm of breast: Secondary | ICD-10-CM

## 2020-01-15 ENCOUNTER — Encounter: Payer: Self-pay | Admitting: Podiatry

## 2020-01-19 ENCOUNTER — Other Ambulatory Visit: Payer: Self-pay

## 2020-01-19 MED ORDER — METOPROLOL SUCCINATE ER 50 MG PO TB24: 50.0000 mg | ORAL_TABLET | Freq: Every day | ORAL | 3 refills | Status: DC

## 2020-01-19 MED ORDER — LOSARTAN POTASSIUM 25 MG PO TABS: 25.0000 mg | ORAL_TABLET | Freq: Every day | ORAL | 3 refills | Status: DC

## 2020-01-21 ENCOUNTER — Other Ambulatory Visit: Payer: Self-pay

## 2020-01-22 ENCOUNTER — Other Ambulatory Visit: Payer: Self-pay

## 2020-01-22 ENCOUNTER — Encounter: Payer: Self-pay | Admitting: Cardiology

## 2020-01-22 ENCOUNTER — Ambulatory Visit: Payer: PRIVATE HEALTH INSURANCE | Admitting: Cardiology

## 2020-01-22 VITALS — BP 108/72 | HR 63 | Ht 64.0 in | Wt 199.0 lb

## 2020-01-22 DIAGNOSIS — I480 Paroxysmal atrial fibrillation: Secondary | ICD-10-CM

## 2020-01-22 MED ORDER — LOSARTAN POTASSIUM 25 MG PO TABS: 25.0000 mg | ORAL_TABLET | Freq: Every day | ORAL | 3 refills | Status: DC

## 2020-01-22 MED ORDER — METOPROLOL SUCCINATE ER 50 MG PO TB24: 50.0000 mg | ORAL_TABLET | Freq: Every day | ORAL | 3 refills | Status: DC

## 2020-01-22 NOTE — Progress Notes (Signed)
Electrophysiology Office Note   Date:  01/22/2020   ID:  Jessica, Moody 20-Aug-1965, MRN [id number]  PCP:  Lauro Regulus, MD  Cardiologist:   Primary Electrophysiologist:  Lela Murfin Jorja Loa, MD    No chief complaint on file.    History of Present Illness: Jessica Moody is a 54 y.o. female who is being seen today for the evaluation of atrial fibrillaiton at the request of Lauro Regulus, MD.   She has a history significant for colon cancer, hyperlipidemia, hypertension.  She also has atrial fibrillation.  She had an echo that showed a mildly reduced ejection fraction.  She was put on optimal heart failure medications, and her ejection fraction improved to 50 to 55%.  Today, denies symptoms of palpitations, chest pain, shortness of breath, orthopnea, PND, lower extremity edema, claudication, dizziness, presyncope, syncope, bleeding, or neurologic sequela. The patient is tolerating medications without difficulties.  Since last being seen she has done well.  She has had 2-year.  These have both been short-lived, only a few hours at a time.  She is overall comfortable with her control.  Past Medical History:  Diagnosis Date  . Afib ([facility])   . Cancer ([facility])    colon  . Colon cancer ([facility])    2015, 10 inches removed  . Depression   . Headache   . Heart murmur   . Hyperlipemia   . Hypertension   . Insomnia   . PONV (postoperative nausea and vomiting)    Past Surgical History:  Procedure Laterality Date  . APPENDECTOMY    . CESAREAN SECTION N/A    C-Section X 2  . COLON SURGERY    . COLONOSCOPY WITH PROPOFOL N/A 11/30/2014   Procedure: COLONOSCOPY WITH PROPOFOL;  Surgeon: Elnita Maxwell, MD;  Location: [facility] ENDOSCOPY;  Service: Endoscopy;  Laterality: N/A;  . GASTRIC BYPASS  2016  . INCISIONAL HERNIA REPAIR Right   . LAPAROSCOPIC GASTRIC RESTRICTIVE DUODENAL PROCEDURE (DUODENAL SWITCH) N/A 06/30/2014   Procedure: LAPAROSCOPIC DUODENAL SWITCH WTIH BILIARY  PANCREATIC DIVERSION ;  Surgeon: Everette Rank, MD;  Location: [facility] ORS;  Service: General;  Laterality: N/A;  . TONSILLECTOMY       Current Outpatient Medications  Medication Sig Dispense Refill  . acetaminophen (TYLENOL) 500 MG tablet Take 1 tablet (500 mg total) by mouth every 6 (six) hours as needed. 30 tablet 0  . Calcium-Magnesium-Vitamin D (CALCIUM 1200+D3 PO) Take 2 capsules by mouth 2 (two) times daily.     . halobetasol (ULTRAVATE) 0.05 % cream Apply topically 2 (two) times daily as needed. Avoid face 50 g 1  . losartan (COZAAR) 25 MG tablet Take 1 tablet (25 mg total) by mouth daily. 90 tablet 3  . metoprolol succinate (TOPROL-XL) 50 MG 24 hr tablet Take 1 tablet (50 mg total) by mouth daily. Take with or immediately following a meal. 90 tablet 3  . Multiple Vitamin (MULTIVITAMIN) tablet Take 1 tablet by mouth daily.    . Probiotic, Lactobacillus, CAPS Take 1 capsule by mouth daily at 12 noon.    Marland Kitchen SAXENDA 18 MG/3ML SOPN Inject 3 mg into the skin daily.    Carlena Hurl 20 MG TABS tablet TAKE 1 TABLET(20 MG) BY MOUTH DAILY WITH SUPPER 30 tablet 5  . XIFAXAN 550 MG TABS tablet Take 550 mg by mouth 3 (three) times daily.     No current facility-administered medications for this visit.    Allergies:   Oxycodone, Poison ivy extract,  Aloe vera, Diclofenac sodium, Nsaids, Prednisone, and Sulfa antibiotics   Social History:  The patient  reports that she quit smoking about 21 years ago. Her smoking use included cigarettes. She smoked 1.00 pack per day. She has never used smokeless tobacco. She reports that she does not drink alcohol and does not use drugs.   Family History:  The patient's family history includes Breast cancer in her paternal aunt; Colon cancer in her cousin; Diabetes in her maternal aunt; Diabetes type II in her mother; Fuch's dystrophy in her paternal aunt; Glaucoma in her father; Heart disease in her father; Heart failure in her mother; Hypertension in her father and  mother; Kidney disease in her mother; Lung cancer in her paternal uncle; Stroke in her father; Uterine cancer in her maternal aunt.   ROS:  Please see the history of present illness.   Otherwise, review of systems is positive for none.   All other systems are reviewed and negative.   PHYSICAL EXAM: VS:  BP 108/72   Pulse 63   Ht 5\' 4"  (1.626 m)   Wt 199 lb (90.3 kg)   LMP 09/29/2015   SpO2 96%   BMI 34.16 kg/m  , BMI Body mass index is 34.16 kg/m. GEN: Well nourished, well developed, in no acute distress  HEENT: normal  Neck: no JVD, carotid bruits, or masses Cardiac: RRR; no murmurs, rubs, or gallops,no edema  Respiratory:  clear to auscultation bilaterally, normal work of breathing GI: soft, nontender, nondistended, + BS MS: no deformity or atrophy  Skin: warm and dry Neuro:  Strength and sensation are intact Psych: euthymic mood, full affect  EKG:  EKG is ordered today. Personal review of the ekg ordered shows sinus rhythm, rate 63  Recent Labs: 12/02/2019: ALT 18    Lipid Panel  No results found for: CHOL, TRIG, HDL, CHOLHDL, VLDL, LDLCALC, LDLDIRECT   Wt Readings from Last 3 Encounters:  01/22/20 199 lb (90.3 kg)  01/07/19 186 lb 9.6 oz (84.6 kg)  01/04/18 190 lb (86.2 kg)      Other studies Reviewed: Additional studies/ records that were reviewed today include: TTE 06/17/2019 1. Normal LV function; mild LVE; trace AI; mild LAE.  2. Left ventricular ejection fraction, by estimation, is 50 to 55%. The  left ventricle has low normal function. The left ventricle has no regional  wall motion abnormalities. The left ventricular internal cavity size was  mildly dilated. Left ventricular  diastolic parameters were normal.  3. Right ventricular systolic function is normal. The right ventricular  size is normal. There is normal pulmonary artery systolic pressure.  4. Left atrial size was mildly dilated.  5. The mitral valve is normal in structure. Trivial mitral  valve  regurgitation. No evidence of mitral stenosis.  6. The aortic valve is tricuspid. Aortic valve regurgitation is trivial.  No aortic stenosis is present.  7. The inferior vena cava is normal in size with greater than 50%  respiratory variability, suggesting right atrial pressure of 3 mmHg.   Myoview 10/03/16  The left ventricular ejection fraction is mildly decreased (45-54%).  Nuclear stress EF: 47%.  There was no ST segment deviation noted during stress.  Defect 1: There is a small defect of moderate severity present in the apex location.  This is a low risk study.   Low risk stress nuclear study with very mild apical ischemia; EF 47 with global hypokinesis and mild LVE.   ASSESSMENT AND PLAN:  1.   Paroxysmal atrial  fibrillation: Currently on Xarelto and Toprol-XL.  CHA2DS2-VASc of 2.  Since her ejection fraction is improved, I do think that we can stop her Xarelto.  Otherwise no changes.  2.  Hypertension: Currently well controlled  3.  Chronic systolic heart failure: Ejection fraction previously 40 to 45%, now improved to 50 to 55%.  Currently on Toprol-XL and losartan.  Feeling well.  No changes.  Current medicines are reviewed at length with the patient today.   The patient does not have concerns regarding her medicines.  The following changes were made today: None  Labs/ tests ordered today include:  Orders Placed This Encounter  Procedures  . EKG 12-Lead   Disposition:   FU with Ellinor Test 12 months  Signed, Cristan Hout Meredith Leeds, MD  01/22/2020 8:20 AM     [facility] [address] [place] [address] [phone] (office) [phone] (fax)

## 2020-01-22 NOTE — Patient Instructions (Signed)
Medication Instructions:  °Your physician recommends that you continue on your current medications as directed. Please refer to the Current Medication list given to you today. ° °*If you need a refill on your cardiac medications before your next appointment, please call your pharmacy* ° ° °Lab Work: °None ordered ° ° °Testing/Procedures: °None ordered ° ° °Follow-Up: °At [facility], you and your health needs are our priority.  As part of our continuing mission to provide you with exceptional heart care, we have created designated Provider Care Teams.  These Care Teams include your primary Cardiologist (physician) and Advanced Practice Providers (APPs -  Physician Assistants and Nurse Practitioners) who all work together to provide you with the care you need, when you need it. ° °Your next appointment:   °1 year(s) ° °The format for your next appointment:   °In Person ° °Provider:   °Will Camnitz, MD ° ° ° °Thank you for choosing [facility]!! ° ° °Clement Deneault, RN °[phone] ° ° ° °

## 2020-01-22 NOTE — Addendum Note (Signed)
Addended by: Baird Lyons on: 01/22/2020 08:23 AM   Modules accepted: Orders

## 2020-03-09 ENCOUNTER — Ambulatory Visit: Payer: PRIVATE HEALTH INSURANCE | Admitting: Dermatology

## 2020-03-09 ENCOUNTER — Other Ambulatory Visit: Payer: Self-pay

## 2020-03-09 DIAGNOSIS — L820 Inflamed seborrheic keratosis: Secondary | ICD-10-CM

## 2020-03-09 DIAGNOSIS — L988 Other specified disorders of the skin and subcutaneous tissue: Secondary | ICD-10-CM

## 2020-03-09 DIAGNOSIS — L814 Other melanin hyperpigmentation: Secondary | ICD-10-CM

## 2020-03-09 NOTE — Progress Notes (Signed)
   Follow-Up Visit   Subjective  Jessica Moody is a 55 y.o. female who presents for the following: Follow-up.  Patient here today for 3 month follow-up of inflamed SK of the right medial shoulder. Improved with cryotherapy.  The following portions of the chart were reviewed this encounter and updated as appropriate:       Review of Systems:  No other skin or systemic complaints except as noted in HPI or Assessment and Plan.  Objective  Well appearing patient in no apparent distress; mood and affect are within normal limits.  A focused examination was performed including face, shoulder. Relevant physical exam findings are noted in the Assessment and Plan.  Objective  Right Medial Shoulder, Right Zygoma: Clear  Objective  Forehead: Rhytides forehead and volume loss.    Assessment & Plan  Inflamed seborrheic keratosis Right Medial Shoulder, Right Zygoma  Improved/clear with cryotherapy. Observe.  Elastosis of skin Forehead  Discussed Botox injections vs topical retinoid/retinol. Patient would benefit more with Botox injections, $13/unit. Lasts 3-4 months. Patient would need at least 25 units to start with. May schedule if interested.  Discussed The Perfect A cream qhs to face as tolerated.  Pt may purchase here.  Lentigines - Scattered tan macules - Discussed due to sun exposure - Benign, observe - Recommend daily broad spectrum sunscreen SPF 30+ to sun-exposed areas, reapply every 2 hours as needed. - Call for any changes  Return in about 9 months (around 12/07/2020) for TBSE.   IJamesetta Orleans, CMA, am acting as scribe for Brendolyn Patty, MD .  Documentation: I have reviewed the above documentation for accuracy and completeness, and I agree with the above.  Brendolyn Patty MD

## 2020-06-01 ENCOUNTER — Encounter: Payer: PRIVATE HEALTH INSURANCE | Admitting: Podiatry

## 2020-06-08 ENCOUNTER — Telehealth: Payer: Self-pay | Admitting: Cardiology

## 2020-06-08 NOTE — Telephone Encounter (Signed)
Lets set her up with a visit in the office.

## 2020-06-08 NOTE — Telephone Encounter (Signed)
Jessica Moody is calling stating her father Jessica Moody DOB 11/05/1923 sees Dr. Bettina Gavia. Due to this [name] requested Dr. Bettina Gavia review both of their medical history. After requesting this Dr. Bettina Gavia advised her to call and setup a virtual appointment with him. [name] also states she would be willing to come in to be seen as well if need be. Did not have documentation to support this in order to just schedule with Dr. Bettina Gavia. Please advise.

## 2020-06-08 NOTE — Telephone Encounter (Signed)
Left message on patients voicemail to please return our call.   

## 2020-06-09 NOTE — Telephone Encounter (Signed)
Left message on patients voicemail to please return our call.   

## 2020-06-10 NOTE — Telephone Encounter (Signed)
Left message on patients voicemail to please return our call.   

## 2020-06-11 NOTE — Telephone Encounter (Signed)
Left message on patients voicemail to please return our call.   

## 2020-06-11 NOTE — Telephone Encounter (Signed)
PT IS RETURNING PHONE CALL IN REGARDS TO AN APPT W/ DR. Bettina Gavia

## 2020-07-16 ENCOUNTER — Other Ambulatory Visit: Payer: Self-pay

## 2020-07-16 ENCOUNTER — Ambulatory Visit
Admission: RE | Admit: 2020-07-16 | Discharge: 2020-07-16 | Disposition: A | Discharge status: Home / Self Care | Payer: No Typology Code available for payment source | Source: Ambulatory Visit | Attending: Obstetrics and Gynecology | Admitting: Obstetrics and Gynecology

## 2020-07-16 DIAGNOSIS — Z1231 Encounter for screening mammogram for malignant neoplasm of breast: Secondary | ICD-10-CM | POA: Diagnosis present

## 2020-08-03 ENCOUNTER — Ambulatory Visit: Payer: BC Managed Care – PPO | Admitting: Dermatology

## 2020-08-03 ENCOUNTER — Other Ambulatory Visit: Payer: Self-pay | Admitting: Dermatology

## 2020-08-03 ENCOUNTER — Other Ambulatory Visit: Payer: Self-pay

## 2020-08-03 DIAGNOSIS — D229 Melanocytic nevi, unspecified: Secondary | ICD-10-CM

## 2020-08-03 DIAGNOSIS — D485 Neoplasm of uncertain behavior of skin: Secondary | ICD-10-CM

## 2020-08-03 DIAGNOSIS — D1809 Hemangioma of other sites: Secondary | ICD-10-CM | POA: Diagnosis not present

## 2020-08-03 DIAGNOSIS — D489 Neoplasm of uncertain behavior, unspecified: Secondary | ICD-10-CM

## 2020-08-03 NOTE — Patient Instructions (Signed)
Biopsy Wound Care Instructions  Leave the original bandage on for 24 hours if possible.  If the bandage becomes soaked or soiled before that time, it is OK to remove it and examine the wound.  A small amount of post-operative bleeding is normal.  If excessive bleeding occurs, remove the bandage, place gauze over the site and apply continuous pressure (no peeking) over the area for 30 minutes. If this does not work, please call our clinic as soon as possible or page your doctor if it is after hours.   Once a day, cleanse the wound with soap and water. It is fine to shower. If a thick crust develops you may use a Q-tip dipped into dilute hydrogen peroxide (mix 1:1 with water) to dissolve it.  Hydrogen peroxide can slow the healing process, so use it only as needed.    After washing, apply petroleum jelly (Vaseline) or an antibiotic ointment if your doctor prescribed one for you, followed by a bandage.    For best healing, the wound should be covered with a layer of ointment at all times. If you are not able to keep the area covered with a bandage to hold the ointment in place, this may mean re-applying the ointment several times a day.  Continue this wound care until the wound has healed and is no longer open.   Itching and mild discomfort is normal during the healing process. However, if you develop pain or severe itching, please call our office.   If you have any discomfort, you can take Tylenol (acetaminophen) or ibuprofen as directed on the bottle. (Please do not take these if you have an allergy to them or cannot take them for another reason).  Some redness, tenderness and white or yellow material in the wound is normal healing.  If the area becomes very sore and red, or develops a thick yellow-green material (pus), it may be infected; please notify us.    If you have stitches, return to clinic as directed to have the stitches removed. You will continue wound care for 2-3 days after the stitches  are removed.   Wound healing continues for up to one year following surgery. It is not unusual to experience pain in the scar from time to time during the interval.  If the pain becomes severe or the scar thickens, you should notify the office.    A slight amount of redness in a scar is expected for the first six months.  After six months, the redness will fade and the scar will soften and fade.  The color difference becomes less noticeable with time.  If there are any problems, return for a post-op surgery check at your earliest convenience.  To improve the appearance of the scar, you can use silicone scar gel, cream, or sheets (such as Mederma or Serica) every night for up to one year. These are available over the counter (without a prescription).  Please call our office at [phone] for any questions or concerns.   Recommend daily broad spectrum sunscreen SPF 30+ to sun-exposed areas, reapply every 2 hours as needed. Call for new or changing lesions.  Staying in the shade or wearing long sleeves, sun glasses (UVA+UVB protection) and wide brim hats (4-inch brim around the entire circumference of the hat) are also recommended for sun protection.    If you have any questions or concerns for your doctor, please call our main line at [phone] and press option 4 to reach your doctor's medical  assistant. If no one answers, please leave a voicemail as directed and we will return your call as soon as possible. Messages left after 4 pm will be answered the following business day.   You may also send [place] a message via [place]. We typically respond to MyChart messages within 1-2 business days.  For prescription refills, please ask your pharmacy to contact our office. Our fax number is [phone].  If you have an urgent issue when the clinic is closed that cannot wait until the next business day, you can page your doctor at the number below.    Please note that while we do our best to be  available for urgent issues outside of office hours, we are not available 24/7.   If you have an urgent issue and are unable to reach [place], you may choose to seek medical care at your doctor's office, retail clinic, urgent care center, or emergency room.  If you have a medical emergency, please immediately call 911 or go to the emergency department.  Pager Numbers  - Dr. Nehemiah Massed: [phone]  - Dr. Laurence Ferrari: [phone]  - Dr. Nicole Kindred: [phone]  In the event of inclement weather, please call our main line at [phone] for an update on the status of any delays or closures.  Dermatology Medication Tips: Please keep the boxes that topical medications come in in order to help keep track of the instructions about where and how to use these. Pharmacies typically print the medication instructions only on the boxes and not directly on the medication tubes.   If your medication is too expensive, please contact our office at [phone] option 4 or send [place] a message through [place].   We are unable to tell what your co-pay for medications will be in advance as this is different depending on your insurance coverage. However, we may be able to find a substitute medication at lower cost or fill out paperwork to get insurance to cover a needed medication.   If a prior authorization is required to get your medication covered by your insurance company, please allow [place] 1-2 business days to complete this process.  Drug prices often vary depending on where the prescription is filled and some pharmacies may offer cheaper prices.  The website www.goodrx.com contains coupons for medications through different pharmacies. The prices here do not account for what the cost may be with help from insurance (it may be cheaper with your insurance), but the website can give you the price if you did not use any insurance.  - You can print the associated coupon and take it with your prescription to the pharmacy.  -  You may also stop by our office during regular business hours and pick up a GoodRx coupon card.  - If you need your prescription sent electronically to a different pharmacy, notify our office through [facility] or by phone at [phone] option 4.

## 2020-08-03 NOTE — Progress Notes (Signed)
   Follow-Up Visit   Subjective  Jessica Moody is a 55 y.o. female who presents for the following: Follow-up (Patient here today concerning a place on right leg she noticed about . Patient reports spot has been present since January or February. Patient reports noticing when shaving and nicked it and bled. Sometimes scaly. ).  The following portions of the chart were reviewed this encounter and updated as appropriate:       Objective  Well appearing patient in no apparent distress; mood and affect are within normal limits.  A focused examination was performed including right medial inferior knee. Relevant physical exam findings are noted in the Assessment and Plan.  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 Anesthesia: the lesion was anesthetized in a standard fashion   Anesthetic:  1% lidocaine w/ epinephrine 1-100,000 buffered w/ 8.4% NaHCO3 Instrument used: flexible razor blade   Hemostasis achieved with: pressure, aluminum chloride and electrodesiccation   Outcome: patient tolerated procedure well   Post-procedure details: wound care instructions given   Post-procedure details comment:  Ointment and small bandage applied  Specimen 1 - Surgical pathology Differential Diagnosis: Irritated nevus vs hemangioma r/o atypia   Check Margins: No  3 mm dark crusted papule   Irritated nevus vs hemangioma r/o atypia   Melanocytic Nevi - Tan-brown and/or pink-flesh-colored symmetric macules and papules - Benign appearing on exam today - Observation - Call clinic for new or changing moles - Recommend daily use of broad spectrum spf 30+ sunscreen to sun-exposed areas.    Return for as scheduled.  I,  Ruthell Rummage, CMA, am acting as scribe for Brendolyn Patty, MD.  Documentation: I have reviewed the above documentation for accuracy and completeness, and I agree with the above.  Brendolyn Patty MD

## 2020-08-11 ENCOUNTER — Telehealth: Payer: Self-pay

## 2020-08-11 NOTE — Telephone Encounter (Signed)
-----   Message from Brendolyn Patty, MD sent at 08/11/2020 11:40 AM EDT ----- Skin , right medial inferior knee HEMANGIOMA WITH THROMBOSIS  Benign - please call patient

## 2020-08-11 NOTE — Telephone Encounter (Signed)
Left pt msg to call for bx results/sh 

## 2020-08-19 ENCOUNTER — Telehealth: Payer: Self-pay

## 2020-08-19 NOTE — Telephone Encounter (Signed)
Advised pt of bx results/sh ?

## 2020-08-19 NOTE — Telephone Encounter (Signed)
-----   Message from Brendolyn Patty, MD sent at 08/11/2020 11:40 AM EDT ----- Skin , right medial inferior knee HEMANGIOMA WITH THROMBOSIS  Benign - please call patient

## 2020-09-21 DIAGNOSIS — Z79899 Other long term (current) drug therapy: Secondary | ICD-10-CM | POA: Diagnosis not present

## 2020-09-21 DIAGNOSIS — Z9884 Bariatric surgery status: Secondary | ICD-10-CM | POA: Diagnosis not present

## 2020-09-21 DIAGNOSIS — Z7689 Persons encountering health services in other specified circumstances: Secondary | ICD-10-CM | POA: Diagnosis not present

## 2020-09-21 DIAGNOSIS — K9089 Other intestinal malabsorption: Secondary | ICD-10-CM | POA: Diagnosis not present

## 2020-12-01 DIAGNOSIS — Z9884 Bariatric surgery status: Secondary | ICD-10-CM | POA: Diagnosis not present

## 2020-12-01 DIAGNOSIS — Z7689 Persons encountering health services in other specified circumstances: Secondary | ICD-10-CM | POA: Diagnosis not present

## 2020-12-07 ENCOUNTER — Ambulatory Visit: Payer: BC Managed Care – PPO | Admitting: Dermatology

## 2020-12-07 ENCOUNTER — Other Ambulatory Visit: Payer: Self-pay

## 2020-12-07 DIAGNOSIS — B353 Tinea pedis: Secondary | ICD-10-CM

## 2020-12-07 DIAGNOSIS — L910 Hypertrophic scar: Secondary | ICD-10-CM

## 2020-12-07 DIAGNOSIS — D229 Melanocytic nevi, unspecified: Secondary | ICD-10-CM

## 2020-12-07 DIAGNOSIS — L814 Other melanin hyperpigmentation: Secondary | ICD-10-CM

## 2020-12-07 DIAGNOSIS — D220 Melanocytic nevi of lip: Secondary | ICD-10-CM | POA: Diagnosis not present

## 2020-12-07 DIAGNOSIS — L821 Other seborrheic keratosis: Secondary | ICD-10-CM

## 2020-12-07 DIAGNOSIS — L720 Epidermal cyst: Secondary | ICD-10-CM

## 2020-12-07 DIAGNOSIS — L578 Other skin changes due to chronic exposure to nonionizing radiation: Secondary | ICD-10-CM

## 2020-12-07 DIAGNOSIS — L299 Pruritus, unspecified: Secondary | ICD-10-CM

## 2020-12-07 DIAGNOSIS — B351 Tinea unguium: Secondary | ICD-10-CM

## 2020-12-07 DIAGNOSIS — Z1283 Encounter for screening for malignant neoplasm of skin: Secondary | ICD-10-CM | POA: Diagnosis not present

## 2020-12-07 DIAGNOSIS — L811 Chloasma: Secondary | ICD-10-CM

## 2020-12-07 DIAGNOSIS — D225 Melanocytic nevi of trunk: Secondary | ICD-10-CM

## 2020-12-07 DIAGNOSIS — D1800 Hemangioma unspecified site: Secondary | ICD-10-CM

## 2020-12-07 DIAGNOSIS — Z9889 Other specified postprocedural states: Secondary | ICD-10-CM

## 2020-12-07 MED ORDER — CICLOPIROX OLAMINE 0.77 % EX CREA: TOPICAL_CREAM | CUTANEOUS | 1 refills | Status: DC

## 2020-12-07 MED ORDER — FLUCONAZOLE 200 MG PO TABS
200.0000 mg | ORAL_TABLET | Freq: Every day | ORAL | 0 refills | Status: DC
Start: 2020-12-07 — End: 2021-04-13

## 2020-12-07 MED ORDER — CLOBETASOL PROPIONATE 0.05 % EX OINT: TOPICAL_OINTMENT | CUTANEOUS | 0 refills | Status: DC

## 2020-12-07 MED ORDER — TAVABOROLE 5 % EX SOLN: CUTANEOUS | 5 refills | Status: DC

## 2020-12-07 NOTE — Progress Notes (Signed)
Follow-Up Visit   Subjective  Jessica Moody is a 55 y.o. female who presents for the following: Annual Exam.  Patient here for TBSE. No history of skin cancer or abnormal moles. She has noticed darkening of her cheeks over the past several months. She has scaliness of the feet and has tried oral treatment and laser in the past for her toenails. She has an itchy scar L shoulder.  The following portions of the chart were reviewed this encounter and updated as appropriate:       Review of Systems:  No other skin or systemic complaints except as noted in HPI or Assessment and Plan.  Objective  Well appearing patient in no apparent distress; mood and affect are within normal limits.  A full examination was performed including scalp, head, eyes, ears, nose, lips, neck, chest, axillae, abdomen, back, buttocks, bilateral upper extremities, bilateral lower extremities, hands, feet, fingers, toes, fingernails, and toenails. All findings within normal limits unless otherwise noted below.  Right Upper Lip 6.29mm flesh papule  R umbilicus 6.7RF med dark brown macule  Left Shoulder Linear firm pink plaque c/w Hypertrophic scar.  bilateral zygoma Reticulated hyperpigmented patches.   feet, toenails Erythema and scaling of the plantar/lateral feet and thickening of the right great toenail and bilateral 5th toenails. Toenails are painted today.    Assessment & Plan  Skin cancer screening performed today.  Actinic Damage - chronic, secondary to cumulative UV radiation exposure/sun exposure over time - diffuse scaly erythematous macules with underlying dyspigmentation - Recommend daily broad spectrum sunscreen SPF 30+ to sun-exposed areas, reapply every 2 hours as needed.  - Recommend staying in the shade or wearing long sleeves, sun glasses (UVA+UVB protection) and wide brim hats (4-inch brim around the entire circumference of the hat). - Call for new or changing lesions.  Lentigines -  Scattered tan macules - Due to sun exposure - Benign-appering, observe - Recommend daily broad spectrum sunscreen SPF 30+ to sun-exposed areas, reapply every 2 hours as needed. - Call for any changes  Seborrheic Keratoses - Stuck-on, waxy, tan-brown papules and/or plaques  - Benign-appearing - Discussed benign etiology and prognosis. - Observe - Call for any changes  Melanocytic Nevi - Tan-brown and/or pink-flesh-colored symmetric macules and papules - Benign appearing on exam today - Observation - Call clinic for new or changing moles - Recommend daily use of broad spectrum spf 30+ sunscreen to sun-exposed areas.   Nevus (2) R umbilicus; Right Upper Lip  Benign-appearing.  Observation.  Call clinic for new or changing moles.  Recommend daily use of broad spectrum spf 30+ sunscreen to sun-exposed areas.   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   Start Clobetasol Cream Apply to scar qd/bid prn itch dsp 15g 0Rf. Avoid face, groin, axilla.   Discussed IL steroid injection if not improving  Topical steroids (such as triamcinolone, fluocinolone, fluocinonide, mometasone, clobetasol, halobetasol, betamethasone, hydrocortisone) can cause thinning and lightening of the skin if they are used for too long in the same area. Your physician has selected the right strength medicine for your problem and area affected on the body. Please use your medication only as directed by your physician to prevent side effects.    clobetasol ointment (TEMOVATE) 0.05 % - Left Shoulder Apply to scar on left shoulder 1-2 times a day as needed for itch. Avoid face, groin, axilla.  Melasma bilateral zygoma  Melasma is a chronic condition  of persistent pigmented patches generally on the face, worse in summer due to higher UV exposure.  Oral estrogen containing BCPs or supplements can exacerbate condition.  Recommend daily broad  spectrum tinted sunscreen SPF 30+ to face, preferably with Zinc or Titanium Dioxide. Discussed Rx topical bleaching creams (i.e. hydroquinone), OTC HelioCare supplement, chemical peels (would need multiple for best result).   Pt defers Rx at this time Start OTC [facility] List Tranexamic Acid Hyperpigmentation Treatment qhs to aas face. Start Elta MD clear tinted facial sunscreen qAM    Tinea pedis of both feet feet, toenails  With Tinea Unguium  Patient will take photo of toenails once polish is removed and send through [facility].   Start Ciclopirox cream Apply to feet and between toes BID x 2-4 weeks dsp 90g 1Rf.   Start Kerydin Solution Apply to toenails QHS until improved dsp 68mL 5Rf.   Start Fluconazole 200mg  take 1 po q week dsp #30 0Rf. (Rx written 1 po QD)-  Rx will be ~7 mo treatment, will need additional 2 months  Discussed toenails can take up to a year to grow out and clear with treatment.  Side effects of fluconazole (diflucan) include nausea, diarrhea, headache, dizziness, taste changes, rare risk of irritation of the liver, allergy, or decreased blood counts (which could show up as infection or tiredness).  Start AmLactin Cream qhs to feet - info given.   ciclopirox (LOPROX) 0.77 % cream - feet, toenails Apply to feet and between toes twice daily for 2-4 weeks.  Tavaborole (KERYDIN) 5 % SOLN - feet, toenails Apply to toenails every night until improved.  fluconazole (DIFLUCAN) 200 MG tablet - feet, toenails Take 1 tablet (200 mg total) by mouth daily.  Milia - tiny firm white papules, including right clavicle - type of cyst - benign - may be extracted if symptomatic - observe  Return in about 3 months (around 03/09/2021) for toenails.  IJamesetta Orleans, CMA, am acting as scribe for Brendolyn Patty, MD .  Documentation: I have reviewed the above documentation for accuracy and completeness, and I agree with the above.  Brendolyn Patty MD

## 2020-12-07 NOTE — Patient Instructions (Addendum)
Fluconazole 200mg  Take 1 pill by mouth every week. (Prescription taken to take daily) Side effects of fluconazole (diflucan) include nausea, diarrhea, headache, dizziness, taste changes, rare risk of irritation of the liver, allergy, or decreased blood counts (which could show up as infection or tiredness).  Melasma is a chronic condition of persistent pigmented patches generally on the face, worse in summer due to higher UV exposure.  Oral estrogen containing BCPs or supplements can exacerbate condition.  Recommend daily broad spectrum tinted sunscreen SPF 30+ to face, preferably with Zinc or Titanium Dioxide. Discussed Rx topical bleaching creams (i.e. hydroquinone), OTC HelioCare supplement, chemical peels (would need multiple for best result). The [facility] List Tranexamic Acid Hyperpigmentation Treatment.  If you have any questions or concerns for your doctor, please call our main line at [phone] and press option 4 to reach your doctor's medical assistant. If no one answers, please leave a voicemail as directed and we will return your call as soon as possible. Messages left after 4 pm will be answered the following business day.   You may also send [place] a message via [place]. We typically respond to MyChart messages within 1-2 business days.  For prescription refills, please ask your pharmacy to contact our office. Our fax number is [phone].  If you have an urgent issue when the clinic is closed that cannot wait until the next business day, you can page your doctor at the number below.    Please note that while we do our best to be available for urgent issues outside of office hours, we are not available 24/7.   If you have an urgent issue and are unable to reach [place], you may choose to seek medical care at your doctor's office, retail clinic, urgent care center, or emergency room.  If you have a medical emergency, please immediately call 911 or go to the emergency department.  Pager  Numbers  - Dr. Nehemiah Massed: [phone]  - Dr. Laurence Ferrari: [phone]  - Dr. Nicole Kindred: [phone]  In the event of inclement weather, please call our main line at [phone] for an update on the status of any delays or closures.  Dermatology Medication Tips: Please keep the boxes that topical medications come in in order to help keep track of the instructions about where and how to use these. Pharmacies typically print the medication instructions only on the boxes and not directly on the medication tubes.   If your medication is too expensive, please contact our office at [phone] option 4 or send [place] a message through [place].   We are unable to tell what your co-pay for medications will be in advance as this is different depending on your insurance coverage. However, we may be able to find a substitute medication at lower cost or fill out paperwork to get insurance to cover a needed medication.   If a prior authorization is required to get your medication covered by your insurance company, please allow [place] 1-2 business days to complete this process.  Drug prices often vary depending on where the prescription is filled and some pharmacies may offer cheaper prices.  The website www.goodrx.com contains coupons for medications through different pharmacies. The prices here do not account for what the cost may be with help from insurance (it may be cheaper with your insurance), but the website can give you the price if you did not use any insurance.  - You can print the associated coupon and take it with your prescription to the pharmacy.  -  You may also stop by our office during regular business hours and pick up a GoodRx coupon card.  - If you need your prescription sent electronically to a different pharmacy, notify our office through [facility] or by phone at [phone] option 4.

## 2021-02-02 ENCOUNTER — Encounter: Payer: Self-pay | Admitting: Cardiology

## 2021-02-02 ENCOUNTER — Ambulatory Visit: Payer: BC Managed Care – PPO | Admitting: Cardiology

## 2021-02-02 ENCOUNTER — Other Ambulatory Visit: Payer: Self-pay

## 2021-02-02 VITALS — BP 110/70 | HR 68 | Ht 64.0 in | Wt 197.4 lb

## 2021-02-02 DIAGNOSIS — I480 Paroxysmal atrial fibrillation: Secondary | ICD-10-CM

## 2021-02-02 DIAGNOSIS — R0789 Other chest pain: Secondary | ICD-10-CM | POA: Diagnosis not present

## 2021-02-02 DIAGNOSIS — I5022 Chronic systolic (congestive) heart failure: Secondary | ICD-10-CM

## 2021-02-02 LAB — BASIC METABOLIC PANEL
BUN/Creatinine Ratio: 10 (ref 9–23)
BUN: 9 mg/dL (ref 6–24)
CO2: 22 mmol/L (ref 20–29)
Calcium: 8.7 mg/dL (ref 8.7–10.2)
Chloride: 106 mmol/L (ref 96–106)
Creatinine, Ser: 0.89 mg/dL (ref 0.57–1.00)
Glucose: 102 mg/dL — ABNORMAL HIGH (ref 70–99)
Potassium: 4.3 mmol/L (ref 3.5–5.2)
Sodium: 138 mmol/L (ref 134–144)
eGFR: 76 mL/min/{1.73_m2} (ref >59)

## 2021-02-02 NOTE — Patient Instructions (Addendum)
Medication Instructions:  Your physician recommends that you continue on your current medications as directed. Please refer to the Current Medication list given to you today.  *If you need a refill on your cardiac medications before your next appointment, please call your pharmacy*   Lab Work: Today: BMET If you have labs (blood work) drawn today and your tests are completely normal, you will receive your results only by: [place] (if you have MyChart) OR A paper copy in the mail If you have any lab test that is abnormal or we need to change your treatment, we will call you to review the results.   Testing/Procedures: Your physician has requested that you have cardiac CT. Cardiac computed tomography (CT) is a painless test that uses an x-ray machine to take clear, detailed pictures of your heart.  Please follow instruction sheet below  Follow-Up: At [facility], you and your health needs are our priority.  As part of our continuing mission to provide you with exceptional heart care, we have created designated Provider Care Teams.  These Care Teams include your primary Cardiologist (physician) and Advanced Practice Providers (APPs -  Physician Assistants and Nurse Practitioners) who all work together to provide you with the care you need, when you need it.  We recommend signing up for the patient portal called "MyChart".  Sign up information is provided on this After Visit Summary.  MyChart is used to connect with patients for Virtual Visits (Telemedicine).  Patients are able to view lab/test results, encounter notes, upcoming appointments, etc.  Non-urgent messages can be sent to your provider as well.   To learn more about what you can do with MyChart, go to [email or web address].    Your next appointment:   1 year(s)  The format for your next appointment:   In Person  Provider:   Allegra Lai, MD    Thank you for choosing [facility]!!   Trinidad Curet, RN [phone]   Other Instructions    Your cardiac CT will be scheduled at:  [facility] [address] [place], [place] [address] [phone]  Please arrive at the [facility] main entrance (entrance A) of [facility] 30 minutes prior to test start time. You can use the FREE valet parking offered at the main entrance (encouraged to control the heart rate for the test) Proceed to the [facility] Radiology Department (first floor) to check-in and test prep.   Please follow these instructions carefully (unless otherwise directed):  On the Night Before the Test: Be sure to Drink plenty of water. Do not consume any caffeinated/decaffeinated beverages or chocolate 12 hours prior to your test. Do not take any antihistamines 12 hours prior to your test.  On the Day of the Test: Drink plenty of water until 1 hour prior to the test. Do not eat any food 4 hours prior to the test. You may take your regular medications prior to the test.  Take metoprolol (Lopressor) two hours prior to test. HOLD Furosemide/Hydrochlorothiazide morning of the test. FEMALES- please wear underwire-free bra if available, avoid dresses & tight clothing      After the Test: Drink plenty of water. After receiving IV contrast, you may experience a mild flushed feeling. This is normal. On occasion, you may experience a mild rash up to 24 hours after the test. This is not dangerous. If this occurs, you can take Benadryl 25 mg and increase your fluid intake. If you experience trouble breathing, this  can be serious. If it is severe call 911 IMMEDIATELY. If it is mild, please call our office. If you take any of these medications: Glipizide/Metformin, Avandament, Glucavance, please do not take 48 hours after completing test unless otherwise instructed.  Please allow 2-4 weeks for scheduling of routine cardiac CTs. Some insurance companies require a pre-authorization which may delay scheduling of this  test.   For non-scheduling related questions, please contact the cardiac imaging nurse navigator should you have any questions/concerns: Marchia Bond, Cardiac Imaging Nurse Navigator Gordy Clement, Cardiac Imaging Nurse Navigator [facility] Direct Office Dial: [phone]   For scheduling needs, including cancellations and rescheduling, please call [place], [phone].

## 2021-02-02 NOTE — Progress Notes (Signed)
Electrophysiology Office Note   Date:  02/03/2021   ID:  Amyrah, Pinkhasov Mar 08, 1965, MRN [id number]  PCP:  Kirk Ruths, MD  Cardiologist:   Primary Electrophysiologist:  Casanova Schurman Meredith Leeds, MD    No chief complaint on file.    History of Present Illness: Jessica Moody is a 56 y.o. female who is being seen today for the evaluation of atrial fibrillaiton at the request of Kirk Ruths, MD.   He has a history standard for colon cancer, hyperlipidemia, hypertension, atrial fibrillation.  She had an echo performed that showed a mildly reduced ejection fraction.  She was put on optimal medical therapy for heart failure and her ejection fraction improved to 50 to 55%.  She has a history significant for colon cancer, hyperlipidemia, hypertension.  She also has atrial fibrillation.  She had an echo that showed a mildly reduced ejection fraction.  She was put on optimal heart failure medications, and her ejection fraction improved to 50 to 55%.  Today, denies symptoms of shortness of breath, orthopnea, PND, lower extremity edema, claudication, dizziness, presyncope, syncope, bleeding, or neurologic sequela. The patient is tolerating medications without difficulties.  She has intermittent palpitations.  She is not worried about her palpitations as they occur very intermittently.  She has been having chest discomfort.  She feels a heaviness in the center of her chest.  When she stops and rests, the heaviness goes away.  Past Medical History:  Diagnosis Date   Afib ([place])    Cancer ([place])    colon   Colon cancer ([place])    2015, 10 inches removed   Depression    Headache    Heart murmur    Hyperlipemia    Hypertension    Insomnia    PONV (postoperative nausea and vomiting)    Past Surgical History:  Procedure Laterality Date   APPENDECTOMY     CESAREAN SECTION N/A    C-Section X 2   COLON SURGERY     COLONOSCOPY WITH PROPOFOL N/A 11/30/2014   Procedure: COLONOSCOPY WITH  PROPOFOL;  Surgeon: Josefine Class, MD;  Location: [facility] ENDOSCOPY;  Service: Endoscopy;  Laterality: N/A;   GASTRIC BYPASS  2016   INCISIONAL HERNIA REPAIR Right    LAPAROSCOPIC GASTRIC RESTRICTIVE DUODENAL PROCEDURE (DUODENAL SWITCH) N/A 06/30/2014   Procedure: LAPAROSCOPIC DUODENAL SWITCH WTIH BILIARY PANCREATIC DIVERSION ;  Surgeon: [name], MD;  Location: [facility] ORS;  Service: General;  Laterality: N/A;   TONSILLECTOMY       Current Outpatient Medications  Medication Sig Dispense Refill   acetaminophen (TYLENOL) 500 MG tablet Take 1 tablet (500 mg total) by mouth every 6 (six) hours as needed. 30 tablet 0   Calcium-Magnesium-Vitamin D (CALCIUM 1200+D3 PO) Take 2 capsules by mouth 2 (two) times daily.      ciclopirox (LOPROX) 0.77 % cream Apply to feet and between toes twice daily for 2-4 weeks. 90 g 1   clobetasol ointment (TEMOVATE) 0.05 % Apply to scar on left shoulder 1-2 times a day as needed for itch. Avoid face, groin, axilla. 15 g 0   fluconazole (DIFLUCAN) 200 MG tablet Take 1 tablet (200 mg total) by mouth daily. (Patient taking differently: Take 200 mg by mouth once a week.) 30 tablet 0   losartan (COZAAR) 25 MG tablet Take 1 tablet (25 mg total) by mouth daily. 90 tablet 3   metoprolol succinate (TOPROL-XL) 50 MG 24 hr tablet Take 1 tablet (50 mg total) by  mouth daily. Take with or immediately following a meal. 90 tablet 3   Multiple Vitamin (MULTIVITAMIN) tablet Take 1 tablet by mouth daily.     Probiotic, Lactobacillus, CAPS Take 1 capsule by mouth daily at 12 noon.     Tavaborole (KERYDIN) 5 % SOLN Apply to toenails every night until improved. 10 mL 5   buPROPion (WELLBUTRIN XL) 300 MG 24 hr tablet Take by mouth.     No current facility-administered medications for this visit.    Allergies:   Oxycodone, Poison ivy extract, Diclofenac sodium, Nsaids, and Prednisone   Social History:  The patient  reports that she quit smoking about 22 years ago. Her smoking use  included cigarettes. She smoked an average of 1 pack per day. She has never used smokeless tobacco. She reports that she does not drink alcohol and does not use drugs.   Family History:  The patient's family history includes Breast cancer in her paternal aunt; Colon cancer in her cousin; Diabetes in her maternal aunt; Diabetes type II in her mother; Fuch's dystrophy in her paternal aunt; Glaucoma in her father; Heart disease in her father; Heart failure in her mother; Hypertension in her father and mother; Kidney disease in her mother; Lung cancer in her paternal uncle; Stroke in her father; Uterine cancer in her maternal aunt.   ROS:  Please see the history of present illness.   Otherwise, review of systems is positive for none.   All other systems are reviewed and negative.   PHYSICAL EXAM: VS:  BP 110/70    Pulse 68    Ht 5\' 4"  (1.626 m)    Wt 197 lb 6.4 oz (89.5 kg)    LMP 09/29/2015    SpO2 97%    BMI 33.88 kg/m  , BMI Body mass index is 33.88 kg/m. GEN: Well nourished, well developed, in no acute distress  HEENT: normal  Neck: no JVD, carotid bruits, or masses Cardiac: RRR; no murmurs, rubs, or gallops,no edema  Respiratory:  clear to auscultation bilaterally, normal work of breathing GI: soft, nontender, nondistended, + BS MS: no deformity or atrophy  Skin: warm and dry Neuro:  Strength and sensation are intact Psych: euthymic mood, full affect  EKG:  EKG is ordered today. Personal review of the ekg ordered shows sinus rhythm  Recent Labs: 02/02/2021: BUN 9; Creatinine, Ser 0.89; Potassium 4.3; Sodium 138    Lipid Panel  No results found for: CHOL, TRIG, HDL, CHOLHDL, VLDL, LDLCALC, LDLDIRECT   Wt Readings from Last 3 Encounters:  02/02/21 197 lb 6.4 oz (89.5 kg)  01/22/20 199 lb (90.3 kg)  01/07/19 186 lb 9.6 oz (84.6 kg)      Other studies Reviewed: Additional studies/ records that were reviewed today include: TTE 06/17/2019  1. Normal LV function; mild LVE; trace AI;  mild LAE.   2. Left ventricular ejection fraction, by estimation, is 50 to 55%. The  left ventricle has low normal function. The left ventricle has no regional  wall motion abnormalities. The left ventricular internal cavity size was  mildly dilated. Left ventricular  diastolic parameters were normal.   3. Right ventricular systolic function is normal. The right ventricular  size is normal. There is normal pulmonary artery systolic pressure.   4. Left atrial size was mildly dilated.   5. The mitral valve is normal in structure. Trivial mitral valve  regurgitation. No evidence of mitral stenosis.   6. The aortic valve is tricuspid. Aortic valve regurgitation is trivial.  No aortic stenosis is present.   7. The inferior vena cava is normal in size with greater than 50%  respiratory variability, suggesting right atrial pressure of 3 mmHg.   Myoview 10/03/16 The left ventricular ejection fraction is mildly decreased (45-54%). Nuclear stress EF: 47%. There was no ST segment deviation noted during stress. Defect 1: There is a small defect of moderate severity present in the apex location. This is a low risk study.   Low risk stress nuclear study with very mild apical ischemia; EF 47 with global hypokinesis and mild LVE.   ASSESSMENT AND PLAN:  1.  Paroxysmal atrial fibrillation: CHA2DS2-VASc of 2 and thus not anticoagulated.  Has had minimal symptoms.  We Omer Puccinelli continue to monitor.  2.  Hypertension: Currently well controlled  3.  Chronic systolic heart failure: Currently on losartan and Toprol-XL.  Fortunately her ejection fraction has improved to low normal.  We Lexton Hidalgo continue to monitor.  4.  Chest pain: Unclear as to the cause.  It is somewhat atypical.  To rule out cardiac issues, we Demetris Meinhardt plan for coronary CT.  1.   Paroxysmal atrial fibrillation: Currently on Xarelto and Toprol-XL.  CHA2DS2-VASc of 2.  Since her ejection fraction is improved, I do think that we can stop her Xarelto.   Otherwise no changes.  2.  Hypertension: Currently well controlled  3.  Chronic systolic heart failure: Ejection fraction previously 40 to 45%, now improved to 50 to 55%.  Currently on Toprol-XL and losartan.  Feeling well.  No changes.  Current medicines are reviewed at length with the patient today.   The patient does not have concerns regarding her medicines.  The following changes were made today: none  Labs/ tests ordered today include:  Orders Placed This Encounter  Procedures   CT CORONARY MORPH W/CTA COR W/SCORE W/CA W/CM &/OR WO/CM   Basic metabolic panel   EKG 79-XTAV   Disposition:   FU with Cortney Beissel 12 months  Signed, Roxene Alviar Meredith Leeds, MD  02/03/2021 7:10 AM     [facility] [address] [place] [address] [phone] (office) [phone] (fax)

## 2021-02-03 ENCOUNTER — Ambulatory Visit: Payer: No Typology Code available for payment source | Admitting: Cardiology

## 2021-02-15 ENCOUNTER — Telehealth (HOSPITAL_COMMUNITY): Payer: Self-pay | Admitting: Emergency Medicine

## 2021-02-15 NOTE — Telephone Encounter (Signed)
Reaching out to patient to offer assistance regarding upcoming cardiac imaging study; pt verbalizes understanding of appt date/time, parking situation and where to check in, pre-test NPO status and medications ordered, and verified current allergies; name and call back number provided for further questions should they arise Marchia Bond RN Navigator Cardiac Imaging [facility] [phone] office [phone] cell  100mg  metoprolol succinate 2 hr prior to scan Denies iv issues Arrival 8a

## 2021-02-17 ENCOUNTER — Other Ambulatory Visit: Payer: Self-pay

## 2021-02-17 ENCOUNTER — Encounter (HOSPITAL_COMMUNITY): Payer: Self-pay

## 2021-02-17 ENCOUNTER — Ambulatory Visit (HOSPITAL_COMMUNITY): Payer: BC Managed Care – PPO

## 2021-02-17 ENCOUNTER — Ambulatory Visit (HOSPITAL_COMMUNITY)
Admission: RE | Admit: 2021-02-17 | Discharge: 2021-02-17 | Disposition: A | Discharge status: Home / Self Care | Payer: BC Managed Care – PPO | Source: Ambulatory Visit | Attending: Cardiology | Admitting: Cardiology

## 2021-02-17 DIAGNOSIS — R0789 Other chest pain: Secondary | ICD-10-CM | POA: Diagnosis not present

## 2021-02-17 MED ORDER — IOHEXOL 350 MG/ML SOLN
95.0000 mL | Freq: Once | INTRAVENOUS | Status: AC | PRN
  Administered 2021-02-17: 95 mL via INTRAVENOUS

## 2021-02-17 MED ORDER — NITROGLYCERIN 0.4 MG SL SUBL
SUBLINGUAL_TABLET | SUBLINGUAL | Status: AC
  Filled 2021-02-17: qty 2

## 2021-02-17 MED ORDER — NITROGLYCERIN 0.4 MG SL SUBL
0.8000 mg | SUBLINGUAL_TABLET | Freq: Once | SUBLINGUAL | Status: AC
  Administered 2021-02-17: 0.8 mg via SUBLINGUAL

## 2021-03-02 DIAGNOSIS — Z9884 Bariatric surgery status: Secondary | ICD-10-CM | POA: Diagnosis not present

## 2021-03-14 ENCOUNTER — Ambulatory Visit: Payer: BC Managed Care – PPO | Admitting: Dermatology

## 2021-03-18 ENCOUNTER — Other Ambulatory Visit: Payer: Self-pay | Admitting: Cardiology

## 2021-04-10 ENCOUNTER — Other Ambulatory Visit: Payer: Self-pay | Admitting: Cardiology

## 2021-04-10 ENCOUNTER — Other Ambulatory Visit: Payer: Self-pay | Admitting: Dermatology

## 2021-04-10 DIAGNOSIS — B353 Tinea pedis: Secondary | ICD-10-CM

## 2021-04-11 NOTE — Telephone Encounter (Signed)
Left message on voicemail to return my call. Dr. Nicole Kindred wants to make sure patient is tolerating Fluconazole before sending in additional refills.  ? ?"As long as medication has been well tolerated, can send in 200 mg tabs PO qd, #10, no rfs.  She should take weekly as before until gone to complete 9 month course for tinea unguium as per note.  Please call patient.  ?Side effects of fluconazole (diflucan) include nausea, diarrhea, headache, dizziness, taste changes, rare risk of irritation of the liver, allergy, or decreased blood counts (which could show up as infection or tiredness)."  ? ?

## 2021-04-13 ENCOUNTER — Other Ambulatory Visit: Payer: Self-pay

## 2021-04-13 DIAGNOSIS — B353 Tinea pedis: Secondary | ICD-10-CM

## 2021-04-13 MED ORDER — TAVABOROLE 5 % EX SOLN: CUTANEOUS | 5 refills | Status: DC

## 2021-04-13 MED ORDER — CICLOPIROX OLAMINE 0.77 % EX CREA: TOPICAL_CREAM | CUTANEOUS | 1 refills | Status: DC

## 2021-04-13 NOTE — Progress Notes (Signed)
Advised patient of Dr. Les Pou message. Patient states that she has been taking the Fluconazole without any s/e. Per Dr. Nicole Kindred rx to state QD but patient advised to take QW until finished. She also asked for refills of other tinea medications so they were sent to [facility].  ?

## 2021-04-14 DIAGNOSIS — R635 Abnormal weight gain: Secondary | ICD-10-CM | POA: Diagnosis not present

## 2021-04-14 DIAGNOSIS — E669 Obesity, unspecified: Secondary | ICD-10-CM | POA: Diagnosis not present

## 2021-04-14 DIAGNOSIS — I10 Essential (primary) hypertension: Secondary | ICD-10-CM | POA: Diagnosis not present

## 2021-04-14 DIAGNOSIS — I4891 Unspecified atrial fibrillation: Secondary | ICD-10-CM | POA: Diagnosis not present

## 2021-04-14 DIAGNOSIS — L292 Pruritus vulvae: Secondary | ICD-10-CM | POA: Diagnosis not present

## 2021-05-09 ENCOUNTER — Ambulatory Visit (INDEPENDENT_AMBULATORY_CARE_PROVIDER_SITE_OTHER): Payer: BC Managed Care – PPO | Admitting: Dermatology

## 2021-05-09 DIAGNOSIS — B353 Tinea pedis: Secondary | ICD-10-CM | POA: Diagnosis not present

## 2021-05-09 DIAGNOSIS — L988 Other specified disorders of the skin and subcutaneous tissue: Secondary | ICD-10-CM

## 2021-05-09 DIAGNOSIS — B351 Tinea unguium: Secondary | ICD-10-CM

## 2021-05-09 MED ORDER — TRETINOIN 0.025 % EX CREA: TOPICAL_CREAM | Freq: Every day | CUTANEOUS | 3 refills | Status: DC

## 2021-05-09 NOTE — Progress Notes (Signed)
? ?Follow-Up Visit ?  ?Subjective  ?Jessica Moody is a 56 y.o. female who presents for the following: Facial Elastosis (Pt here for Botox injections today ) and Follow-up (Tinea pedis with Tinea Unguium, pt taking Diflucan 200 mg once a week and using Ciclopirox cream, Kerydin topical with a good response). No side effects from fluconazole. Today is first time she has had botox injections. ? ? ? ?The following portions of the chart were reviewed this encounter and updated as appropriate:  ?  ?  ? ?Review of Systems:  No other skin or systemic complaints except as noted in HPI or Assessment and Plan. ? ?Objective  ?Well appearing patient in no apparent distress; mood and affect are within normal limits. ? ?A focused examination was performed including face,toenails. Relevant physical exam findings are noted in the Assessment and Plan. ? ?face ?Rhytides and volume loss.  ? ? ? ? ? ? ? ? ? ? ? ? ? ? ?feet ?Significant improvement compared to photos patient has in the office, distal yellow brown discoloration, onycholysis, and thickening BL great toenails. ? ? ? ?Assessment & Plan  ?Elastosis of skin ?face ? ?Botox 25 units injected today to: ?Forehead 5 units  ?Frown complex 20 units  ? ?Pt has prominent forehead wrinkles, may need additional 5 units to forehead, also may need 2.5 units for BL botox comma ? ?Start Tretinoin 0.025% cream apply a thin film to face at bedtime as tolerated ? ?Recommend daily broad spectrum sunscreen SPF 30+ to face ?Staying in the shade, sun glasses (UVA+UVB protection) and wide brim hats (4-inch brim around the entire circumference of the hat) are also recommended for sun protection.  ? ?Topical retinoid medications like tretinoin/Retin-A, adapalene/Differin, tazarotene/Fabior, and Epiduo/Epiduo Forte can cause dryness and irritation when first started. Only apply a pea-sized amount to the entire affected area. Avoid applying it around the eyes, edges of mouth and creases at the nose. If  you experience irritation, use a good moisturizer first and/or apply the medicine less often. If you are doing well with the medicine, you can increase how often you use it until you are applying every night. Be careful with sun protection while using this medication as it can make you sensitive to the sun. This medicine should not be used by pregnant women.    ? ?tretinoin (RETIN-A) 0.025 % cream - face ?Apply topically at bedtime. ? ?Intralesional injection - face ?Location: See attached image ? ?Informed consent: Discussed risks (infection, pain, bleeding, bruising, swelling, allergic reaction, paralysis of nearby muscles, eyelid droop, double vision, neck weakness, difficulty breathing, headache, undesirable cosmetic result, and need for additional treatment) and benefits of the procedure, as well as the alternatives.  Informed consent was obtained. ? ?Preparation: The area was cleansed with alcohol. ? ?Procedure Details:  Botox was injected into the dermis with a 30-gauge needle. Pressure applied to any bleeding. Ice packs offered for swelling. ? ?Lot Number:  [id number] ?Expiration:  04/24/2023 ? ?Total Units Injected:  25 units  ? ?Plan: Patient was instructed to remain upright for 4 hours. Patient was instructed to avoid massaging the face and avoid vigorous exercise for the rest of the day. Tylenol may be used for headache.  Allow 2 weeks before returning to clinic for additional dosing as needed. Patient will call for any problems. ? ? ?Tinea pedis of both feet ?feet ? ?With Tinea Unguium- Significant improvement compared to baseline patient photos- patient has on her phone.  ? ?Chronic and  persistent condition with duration or expected duration over one year. Improving but not to goal.  ? ?  ?Discussed toenails can take up to a year to grow out and clear with treatment. ? ?Continue fluconazole 200 mg PO qwk until gone (for ~ 9 month course) ?Side effects of fluconazole (diflucan) include nausea, diarrhea,  headache, dizziness, taste changes, rare risk of irritation of the liver, allergy, or decreased blood counts (which could show up as infection or tiredness). ? ?Continue Ciclopirox cream apply to feet daily ?Continue Kerydin solution apply to toenails at bedtime  ? ?Related Medications ?fluconazole (DIFLUCAN) 200 MG tablet ?Take 1 tablet (200 mg total) by mouth daily. ? ?ciclopirox (LOPROX) 0.77 % cream ?Apply to feet and between toes twice daily for 2-4 weeks. ? ?Tavaborole (KERYDIN) 5 % SOLN ?Apply to toenails every night until improved. ? ? ?Return in about 2 weeks (around 05/23/2021) for Botox follow up. ? ?I, Marye Round, CMA, am acting as scribe for Brendolyn Patty, MD .  ? ?Documentation: I have reviewed the above documentation for accuracy and completeness, and I agree with the above. ? ?Brendolyn Patty MD  ?

## 2021-05-09 NOTE — Patient Instructions (Signed)

## 2021-05-10 ENCOUNTER — Telehealth: Payer: Self-pay

## 2021-05-10 NOTE — Telephone Encounter (Signed)
Called patient and notified her of the Tretinoin cream being denied by her insurance. Good Rx coupon code sent via text to patient.  ?

## 2021-05-16 DIAGNOSIS — R635 Abnormal weight gain: Secondary | ICD-10-CM | POA: Diagnosis not present

## 2021-05-16 DIAGNOSIS — E669 Obesity, unspecified: Secondary | ICD-10-CM | POA: Diagnosis not present

## 2021-05-16 DIAGNOSIS — I4891 Unspecified atrial fibrillation: Secondary | ICD-10-CM | POA: Diagnosis not present

## 2021-05-16 DIAGNOSIS — Z9884 Bariatric surgery status: Secondary | ICD-10-CM | POA: Diagnosis not present

## 2021-05-16 DIAGNOSIS — R5383 Other fatigue: Secondary | ICD-10-CM | POA: Diagnosis not present

## 2021-05-16 DIAGNOSIS — I10 Essential (primary) hypertension: Secondary | ICD-10-CM | POA: Diagnosis not present

## 2021-05-23 ENCOUNTER — Ambulatory Visit: Payer: BC Managed Care – PPO | Admitting: Dermatology

## 2021-05-23 DIAGNOSIS — R7309 Other abnormal glucose: Secondary | ICD-10-CM | POA: Diagnosis not present

## 2021-05-26 DIAGNOSIS — E669 Obesity, unspecified: Secondary | ICD-10-CM | POA: Diagnosis not present

## 2021-05-26 DIAGNOSIS — I4891 Unspecified atrial fibrillation: Secondary | ICD-10-CM | POA: Diagnosis not present

## 2021-05-26 DIAGNOSIS — I10 Essential (primary) hypertension: Secondary | ICD-10-CM | POA: Diagnosis not present

## 2021-05-26 DIAGNOSIS — R635 Abnormal weight gain: Secondary | ICD-10-CM | POA: Diagnosis not present

## 2021-06-23 DIAGNOSIS — R7309 Other abnormal glucose: Secondary | ICD-10-CM | POA: Diagnosis not present

## 2021-07-11 DIAGNOSIS — Z1231 Encounter for screening mammogram for malignant neoplasm of breast: Secondary | ICD-10-CM | POA: Diagnosis not present

## 2021-07-11 DIAGNOSIS — Z1331 Encounter for screening for depression: Secondary | ICD-10-CM | POA: Diagnosis not present

## 2021-07-11 DIAGNOSIS — N898 Other specified noninflammatory disorders of vagina: Secondary | ICD-10-CM | POA: Diagnosis not present

## 2021-07-11 DIAGNOSIS — Z01419 Encounter for gynecological examination (general) (routine) without abnormal findings: Secondary | ICD-10-CM | POA: Diagnosis not present

## 2021-07-13 DIAGNOSIS — I4891 Unspecified atrial fibrillation: Secondary | ICD-10-CM | POA: Diagnosis not present

## 2021-07-13 DIAGNOSIS — E669 Obesity, unspecified: Secondary | ICD-10-CM | POA: Diagnosis not present

## 2021-07-13 DIAGNOSIS — I10 Essential (primary) hypertension: Secondary | ICD-10-CM | POA: Diagnosis not present

## 2021-07-13 DIAGNOSIS — K219 Gastro-esophageal reflux disease without esophagitis: Secondary | ICD-10-CM | POA: Diagnosis not present

## 2021-07-18 ENCOUNTER — Encounter: Payer: Self-pay | Admitting: Cardiology

## 2021-07-18 MED ORDER — METOPROLOL SUCCINATE ER 50 MG PO TB24: 50.0000 mg | ORAL_TABLET | Freq: Every day | ORAL | 1 refills | Status: DC

## 2021-07-18 MED ORDER — LOSARTAN POTASSIUM 25 MG PO TABS: 25.0000 mg | ORAL_TABLET | Freq: Every day | ORAL | 1 refills | Status: DC

## 2021-07-23 DIAGNOSIS — R7309 Other abnormal glucose: Secondary | ICD-10-CM | POA: Diagnosis not present

## 2021-08-23 DIAGNOSIS — R7309 Other abnormal glucose: Secondary | ICD-10-CM | POA: Diagnosis not present

## 2021-08-24 ENCOUNTER — Other Ambulatory Visit: Payer: Self-pay | Admitting: Obstetrics and Gynecology

## 2021-08-24 DIAGNOSIS — Z1231 Encounter for screening mammogram for malignant neoplasm of breast: Secondary | ICD-10-CM

## 2021-08-31 ENCOUNTER — Telehealth: Payer: Self-pay

## 2021-08-31 NOTE — Telephone Encounter (Signed)
Patient called to see if she needed an appointment for a refill of her Fluconazole '200mg'$ . Per her note from 12/07/20 she was to only be only on it for 9 months. Patient just wanted to verify that is still the case?

## 2021-09-01 DIAGNOSIS — Z713 Dietary counseling and surveillance: Secondary | ICD-10-CM | POA: Diagnosis not present

## 2021-09-05 NOTE — Telephone Encounter (Signed)
Called patient and made her aware of Dr. Les Pou instructions. Per patient her nails have tremendously improved and she states that further treatment is not needed at this time.

## 2021-09-07 ENCOUNTER — Ambulatory Visit: Payer: BC Managed Care – PPO | Admitting: Dermatology

## 2021-09-08 DIAGNOSIS — I4891 Unspecified atrial fibrillation: Secondary | ICD-10-CM | POA: Diagnosis not present

## 2021-09-08 DIAGNOSIS — I10 Essential (primary) hypertension: Secondary | ICD-10-CM | POA: Diagnosis not present

## 2021-09-08 DIAGNOSIS — E669 Obesity, unspecified: Secondary | ICD-10-CM | POA: Diagnosis not present

## 2021-09-08 DIAGNOSIS — K219 Gastro-esophageal reflux disease without esophagitis: Secondary | ICD-10-CM | POA: Diagnosis not present

## 2021-09-15 ENCOUNTER — Ambulatory Visit
Admission: RE | Admit: 2021-09-15 | Discharge: 2021-09-15 | Disposition: A | Discharge status: Home / Self Care | Payer: BC Managed Care – PPO | Source: Ambulatory Visit | Attending: Obstetrics and Gynecology | Admitting: Obstetrics and Gynecology

## 2021-09-15 DIAGNOSIS — Z1231 Encounter for screening mammogram for malignant neoplasm of breast: Secondary | ICD-10-CM | POA: Diagnosis not present

## 2021-09-23 DIAGNOSIS — R7309 Other abnormal glucose: Secondary | ICD-10-CM | POA: Diagnosis not present

## 2021-10-18 DIAGNOSIS — R87615 Unsatisfactory cytologic smear of cervix: Secondary | ICD-10-CM | POA: Diagnosis not present

## 2021-10-20 DIAGNOSIS — E669 Obesity, unspecified: Secondary | ICD-10-CM | POA: Diagnosis not present

## 2021-10-20 DIAGNOSIS — Z713 Dietary counseling and surveillance: Secondary | ICD-10-CM | POA: Diagnosis not present

## 2021-10-20 DIAGNOSIS — Z9884 Bariatric surgery status: Secondary | ICD-10-CM | POA: Diagnosis not present

## 2021-10-20 DIAGNOSIS — Z6833 Body mass index (BMI) 33.0-33.9, adult: Secondary | ICD-10-CM | POA: Diagnosis not present

## 2021-10-23 DIAGNOSIS — R7309 Other abnormal glucose: Secondary | ICD-10-CM | POA: Diagnosis not present

## 2021-11-08 DIAGNOSIS — F4323 Adjustment disorder with mixed anxiety and depressed mood: Secondary | ICD-10-CM | POA: Diagnosis not present

## 2021-11-16 DIAGNOSIS — F4323 Adjustment disorder with mixed anxiety and depressed mood: Secondary | ICD-10-CM | POA: Diagnosis not present

## 2021-11-23 DIAGNOSIS — R7309 Other abnormal glucose: Secondary | ICD-10-CM | POA: Diagnosis not present

## 2021-11-30 DIAGNOSIS — F411 Generalized anxiety disorder: Secondary | ICD-10-CM | POA: Diagnosis not present

## 2021-12-01 DIAGNOSIS — Z9884 Bariatric surgery status: Secondary | ICD-10-CM | POA: Diagnosis not present

## 2021-12-01 DIAGNOSIS — Z6830 Body mass index (BMI) 30.0-30.9, adult: Secondary | ICD-10-CM | POA: Diagnosis not present

## 2021-12-01 DIAGNOSIS — E669 Obesity, unspecified: Secondary | ICD-10-CM | POA: Diagnosis not present

## 2021-12-01 DIAGNOSIS — K219 Gastro-esophageal reflux disease without esophagitis: Secondary | ICD-10-CM | POA: Diagnosis not present

## 2021-12-23 DIAGNOSIS — R7309 Other abnormal glucose: Secondary | ICD-10-CM | POA: Diagnosis not present

## 2021-12-30 DIAGNOSIS — F411 Generalized anxiety disorder: Secondary | ICD-10-CM | POA: Diagnosis not present

## 2022-01-12 DIAGNOSIS — F411 Generalized anxiety disorder: Secondary | ICD-10-CM | POA: Diagnosis not present

## 2022-01-23 DIAGNOSIS — R7309 Other abnormal glucose: Secondary | ICD-10-CM | POA: Diagnosis not present

## 2022-01-26 DIAGNOSIS — F411 Generalized anxiety disorder: Secondary | ICD-10-CM | POA: Diagnosis not present

## 2022-02-08 DIAGNOSIS — F411 Generalized anxiety disorder: Secondary | ICD-10-CM | POA: Diagnosis not present

## 2022-02-09 ENCOUNTER — Encounter: Payer: Self-pay | Admitting: Cardiology

## 2022-02-09 ENCOUNTER — Ambulatory Visit: Payer: BC Managed Care – PPO | Attending: Cardiology | Admitting: Cardiology

## 2022-02-09 VITALS — BP 110/68 | HR 69 | Ht 64.0 in | Wt 170.0 lb

## 2022-02-09 DIAGNOSIS — I480 Paroxysmal atrial fibrillation: Secondary | ICD-10-CM

## 2022-02-09 DIAGNOSIS — I10 Essential (primary) hypertension: Secondary | ICD-10-CM

## 2022-02-09 NOTE — Progress Notes (Signed)
Electrophysiology Office Note   Date:  02/09/2022   ID:  Harold, Mattes [birth year], MRN [id number]  PCP:  Kirk Ruths, MD  Cardiologist:   Primary Electrophysiologist:  Rebakah Cokley Meredith Leeds, MD    No chief complaint on file.    History of Present Illness: Jessica Moody is a 57 y.o. female who is being seen today for the evaluation of atrial fibrillaiton at the request of Kirk Ruths, MD.   She has a history significant for colon cancer, hyperlipidemia, hypertension, atrial fibrillation.  She had an echo that showed a mildly reduced ejection fraction.  She was put on medical therapy for heart failure and ejection fraction improved to 50 to 55%.  Today, denies symptoms of palpitations, chest pain, shortness of breath, orthopnea, PND, lower extremity edema, claudication, dizziness, presyncope, syncope, bleeding, or neurologic sequela. The patient is tolerating medications without difficulties.     Past Medical History:  Diagnosis Date   Afib ([place])    Cancer ([place])    colon   Colon cancer ([place])    2015, 10 inches removed   Depression    Headache    Heart murmur    Hyperlipemia    Hypertension    Insomnia    PONV (postoperative nausea and vomiting)    Past Surgical History:  Procedure Laterality Date   APPENDECTOMY     CESAREAN SECTION N/A    C-Section X 2   COLON SURGERY     COLONOSCOPY WITH PROPOFOL N/A 11/30/2014   Procedure: COLONOSCOPY WITH PROPOFOL;  Surgeon: Josefine Class, MD;  Location: [facility] ENDOSCOPY;  Service: Endoscopy;  Laterality: N/A;   GASTRIC BYPASS  2016   INCISIONAL HERNIA REPAIR Right    LAPAROSCOPIC GASTRIC RESTRICTIVE DUODENAL PROCEDURE (DUODENAL SWITCH) N/A 06/30/2014   Procedure: LAPAROSCOPIC DUODENAL SWITCH WTIH BILIARY PANCREATIC DIVERSION ;  Surgeon: [name], MD;  Location: [facility] ORS;  Service: General;  Laterality: N/A;   TONSILLECTOMY       Current Outpatient Medications  Medication Sig Dispense Refill    acetaminophen (TYLENOL) 500 MG tablet Take 1 tablet (500 mg total) by mouth every 6 (six) hours as needed. 30 tablet 0   Calcium-Magnesium-Vitamin D (CALCIUM 1200+D3 PO) Take 2 capsules by mouth 2 (two) times daily.      cycloSPORINE (RESTASIS) 0.05 % ophthalmic emulsion Place 1 drop into both eyes 2 (two) times daily.     Estradiol 10 MCG TABS vaginal tablet Place 1 tablet vaginally 2 (two) times a week.     ferrous sulfate 325 (65 FE) MG EC tablet Take 1 tablet by mouth daily with breakfast.     losartan (COZAAR) 25 MG tablet Take 1 tablet (25 mg total) by mouth daily. 90 tablet 1   metoprolol succinate (TOPROL-XL) 50 MG 24 hr tablet Take 1 tablet (50 mg total) by mouth daily. TAKE WITH OR IMMEDIATELY FOLLOWING A MEAL. 90 tablet 1   Multiple Vitamin (MULTIVITAMIN) tablet Take 1 tablet by mouth daily.     Probiotic, Lactobacillus, CAPS Take 1 capsule by mouth daily at 12 noon.     Tavaborole (KERYDIN) 5 % SOLN Apply to toenails every night until improved. 10 mL 5   WEGOVY 1.7 MG/0.75ML SOAJ Inject 1.7 mg into the skin once a week.     buPROPion (WELLBUTRIN XL) 300 MG 24 hr tablet Take by mouth.     No current facility-administered medications for this visit.    Allergies:   Oxycodone, Poison ivy extract,  Diclofenac sodium, Nsaids, and Prednisone   Social History:  The patient  reports that she quit smoking about 23 years ago. Her smoking use included cigarettes. She smoked an average of 1 pack per day. She has never used smokeless tobacco. She reports that she does not drink alcohol and does not use drugs.   Family History:  The patient's family history includes Breast cancer in her paternal aunt; Colon cancer in her cousin; Diabetes in her maternal aunt; Diabetes type II in her mother; Fuch's dystrophy in her paternal aunt; Glaucoma in her father; Heart disease in her father; Heart failure in her mother; Hypertension in her father and mother; Kidney disease in her mother; Lung cancer in her  paternal uncle; Stroke in her father; Uterine cancer in her maternal aunt.   ROS:  Please see the history of present illness.   Otherwise, review of systems is positive for none.   All other systems are reviewed and negative.   PHYSICAL EXAM: VS:  BP 110/68   Pulse 69   Ht '5\' 4"'$  (1.626 m)   Wt 170 lb (77.1 kg) Comment: pt had weight loss  LMP 09/29/2015   SpO2 97%   BMI 29.18 kg/m  , BMI Body mass index is 29.18 kg/m. GEN: Well nourished, well developed, in no acute distress  HEENT: normal  Neck: no JVD, carotid bruits, or masses Cardiac: RRR; no murmurs, rubs, or gallops,no edema  Respiratory:  clear to auscultation bilaterally, normal work of breathing GI: soft, nontender, nondistended, + BS MS: no deformity or atrophy  Skin: warm and dry Neuro:  Strength and sensation are intact Psych: euthymic mood, full affect  EKG:  EKG is ordered today. Personal review of the ekg ordered shows sinus rhythm   Recent Labs: No results found for requested labs within last 365 days.    Lipid Panel  No results found for: "CHOL", "TRIG", "HDL", "CHOLHDL", "VLDL", "LDLCALC", "LDLDIRECT"   Wt Readings from Last 3 Encounters:  02/09/22 170 lb (77.1 kg)  02/02/21 197 lb 6.4 oz (89.5 kg)  01/22/20 199 lb (90.3 kg)      Other studies Reviewed: Additional studies/ records that were reviewed today include: TTE 06/17/2019  1. Normal LV function; mild LVE; trace AI; mild LAE.   2. Left ventricular ejection fraction, by estimation, is 50 to 55%. The  left ventricle has low normal function. The left ventricle has no regional  wall motion abnormalities. The left ventricular internal cavity size was  mildly dilated. Left ventricular  diastolic parameters were normal.   3. Right ventricular systolic function is normal. The right ventricular  size is normal. There is normal pulmonary artery systolic pressure.   4. Left atrial size was mildly dilated.   5. The mitral valve is normal in structure.  Trivial mitral valve  regurgitation. No evidence of mitral stenosis.   6. The aortic valve is tricuspid. Aortic valve regurgitation is trivial.  No aortic stenosis is present.   7. The inferior vena cava is normal in size with greater than 50%  respiratory variability, suggesting right atrial pressure of 3 mmHg.   Myoview 10/03/16 The left ventricular ejection fraction is mildly decreased (45-54%). Nuclear stress EF: 47%. There was no ST segment deviation noted during stress. Defect 1: There is a small defect of moderate severity present in the apex location. This is a low risk study.   Low risk stress nuclear study with very mild apical ischemia; EF 47 with global hypokinesis and mild LVE.  ASSESSMENT AND PLAN:  1.  Paroxysmal atrial fibrillation: CHA2DS2-VASc of 2.  Not anticoagulated.  Has minimal symptoms.  Continue with current management.  2.  Hypertension:well conrolled  3.  Chronic systolic heart failure: Currently on losartan and Toprol-XL.  Ejection fraction is improved to low normal.  Continue to monitor.   Current medicines are reviewed at length with the patient today.   The patient does not have concerns regarding her medicines.  The following changes were made today: none  Labs/ tests ordered today include:  Orders Placed This Encounter  Procedures   EKG 12-Lead   Disposition:   FU 12 months  Signed, Monta Maiorana Meredith Leeds, MD  02/09/2022 9:55 AM     [address] [place] [address] [phone] (office) [phone] (fax)

## 2022-02-14 ENCOUNTER — Other Ambulatory Visit: Payer: Self-pay

## 2022-02-14 MED ORDER — METOPROLOL SUCCINATE ER 50 MG PO TB24: 50.0000 mg | ORAL_TABLET | Freq: Every day | ORAL | 3 refills | Status: DC

## 2022-02-23 DIAGNOSIS — Z713 Dietary counseling and surveillance: Secondary | ICD-10-CM | POA: Diagnosis not present

## 2022-02-23 DIAGNOSIS — E663 Overweight: Secondary | ICD-10-CM | POA: Diagnosis not present

## 2022-02-23 DIAGNOSIS — Z6827 Body mass index (BMI) 27.0-27.9, adult: Secondary | ICD-10-CM | POA: Diagnosis not present

## 2022-02-23 DIAGNOSIS — Z9884 Bariatric surgery status: Secondary | ICD-10-CM | POA: Diagnosis not present

## 2022-02-23 DIAGNOSIS — R7309 Other abnormal glucose: Secondary | ICD-10-CM | POA: Diagnosis not present

## 2022-02-28 DIAGNOSIS — F411 Generalized anxiety disorder: Secondary | ICD-10-CM | POA: Diagnosis not present

## 2022-03-14 DIAGNOSIS — F411 Generalized anxiety disorder: Secondary | ICD-10-CM | POA: Diagnosis not present

## 2022-03-24 DIAGNOSIS — R7309 Other abnormal glucose: Secondary | ICD-10-CM | POA: Diagnosis not present

## 2022-03-29 DIAGNOSIS — F411 Generalized anxiety disorder: Secondary | ICD-10-CM | POA: Diagnosis not present

## 2022-04-17 ENCOUNTER — Encounter: Payer: Self-pay | Admitting: Dermatology

## 2022-04-17 ENCOUNTER — Ambulatory Visit (INDEPENDENT_AMBULATORY_CARE_PROVIDER_SITE_OTHER): Payer: Self-pay | Admitting: Dermatology

## 2022-04-17 VITALS — BP 109/64 | HR 75

## 2022-04-17 DIAGNOSIS — L578 Other skin changes due to chronic exposure to nonionizing radiation: Secondary | ICD-10-CM

## 2022-04-17 NOTE — Patient Instructions (Signed)
Due to recent changes in healthcare laws, you may see results of your pathology and/or laboratory studies on MyChart before the doctors have had a chance to review them. We understand that in some cases there may be results that are confusing or concerning to you. Please understand that not all results are received at the same time and often the doctors may need to interpret multiple results in order to provide you with the best plan of care or course of treatment. Therefore, we ask that you please give us 2 business days to thoroughly review all your results before contacting the office for clarification. Should we see a critical lab result, you will be contacted sooner.   If You Need Anything After Your Visit  If you have any questions or concerns for your doctor, please call our main line at [phone] and press option 4 to reach your doctor's medical assistant. If no one answers, please leave a voicemail as directed and we will return your call as soon as possible. Messages left after 4 pm will be answered the following business day.   You may also send us a message via MyChart. We typically respond to MyChart messages within 1-2 business days.  For prescription refills, please ask your pharmacy to contact our office. Our fax number is [phone].  If you have an urgent issue when the clinic is closed that cannot wait until the next business day, you can page your doctor at the number below.    Please note that while we do our best to be available for urgent issues outside of office hours, we are not available 24/7.   If you have an urgent issue and are unable to reach us, you may choose to seek medical care at your doctor's office, retail clinic, urgent care center, or emergency room.  If you have a medical emergency, please immediately call 911 or go to the emergency department.  Pager Numbers  - Dr. Kowalski: [phone]  - Dr. Moye: [phone]  - Dr. Stewart:  [phone]  In the event of inclement weather, please call our main line at [phone] for an update on the status of any delays or closures.  Dermatology Medication Tips: Please keep the boxes that topical medications come in in order to help keep track of the instructions about where and how to use these. Pharmacies typically print the medication instructions only on the boxes and not directly on the medication tubes.   If your medication is too expensive, please contact our office at [phone] option 4 or send us a message through MyChart.   We are unable to tell what your co-pay for medications will be in advance as this is different depending on your insurance coverage. However, we may be able to find a substitute medication at lower cost or fill out paperwork to get insurance to cover a needed medication.   If a prior authorization is required to get your medication covered by your insurance company, please allow us 1-2 business days to complete this process.  Drug prices often vary depending on where the prescription is filled and some pharmacies may offer cheaper prices.  The website www.goodrx.com contains coupons for medications through different pharmacies. The prices here do not account for what the cost may be with help from insurance (it may be cheaper with your insurance), but the website can give you the price if you did not use any insurance.  - You can print the associated coupon and take it with   your prescription to the pharmacy.  - You may also stop by our office during regular business hours and pick up a GoodRx coupon card.  - If you need your prescription sent electronically to a different pharmacy, notify our office through [facility] MyChart or by phone at [phone] option 4.     Si Usted Necesita Algo Despus de Su Visita  Tambin puede enviarnos un mensaje a travs de MyChart. Por lo general respondemos a los mensajes de MyChart en el transcurso de 1 a 2  das hbiles.  Para renovar recetas, por favor pida a su farmacia que se ponga en contacto con nuestra oficina. Nuestro nmero de fax es el [phone].  Si tiene un asunto urgente cuando la clnica est cerrada y que no puede esperar hasta el siguiente da hbil, puede llamar/localizar a su doctor(a) al nmero que aparece a continuacin.   Por favor, tenga en cuenta que aunque hacemos todo lo posible para estar disponibles para asuntos urgentes fuera del horario de oficina, no estamos disponibles las 24 horas del da, los 7 das de la semana.   Si tiene un problema urgente y no puede comunicarse con nosotros, puede optar por buscar atencin mdica  en el consultorio de su doctor(a), en una clnica privada, en un centro de atencin urgente o en una sala de emergencias.  Si tiene una emergencia mdica, por favor llame inmediatamente al 911 o vaya a la sala de emergencias.  Nmeros de bper  - Dr. Kowalski: [phone]  - Dra. Moye: [phone]  - Dra. Stewart: [phone]  En caso de inclemencias del tiempo, por favor llame a nuestra lnea principal al [phone] para una actualizacin sobre el estado de cualquier retraso o cierre.  Consejos para la medicacin en dermatologa: Por favor, guarde las cajas en las que vienen los medicamentos de uso tpico para ayudarle a seguir las instrucciones sobre dnde y cmo usarlos. Las farmacias generalmente imprimen las instrucciones del medicamento slo en las cajas y no directamente en los tubos del medicamento.   Si su medicamento es muy caro, por favor, pngase en contacto con nuestra oficina llamando al [phone] y presione la opcin 4 o envenos un mensaje a travs de MyChart.   No podemos decirle cul ser su copago por los medicamentos por adelantado ya que esto es diferente dependiendo de la cobertura de su seguro. Sin embargo, es posible que podamos encontrar un medicamento sustituto a menor costo o llenar un formulario para que el  seguro cubra el medicamento que se considera necesario.   Si se requiere una autorizacin previa para que su compaa de seguros cubra su medicamento, por favor permtanos de 1 a 2 das hbiles para completar este proceso.  Los precios de los medicamentos varan con frecuencia dependiendo del lugar de dnde se surte la receta y alguna farmacias pueden ofrecer precios ms baratos.  El sitio web www.goodrx.com tiene cupones para medicamentos de diferentes farmacias. Los precios aqu no tienen en cuenta lo que podra costar con la ayuda del seguro (puede ser ms barato con su seguro), pero el sitio web puede darle el precio si no utiliz ningn seguro.  - Puede imprimir el cupn correspondiente y llevarlo con su receta a la farmacia.  - Tambin puede pasar por nuestra oficina durante el horario de atencin regular y recoger una tarjeta de cupones de GoodRx.  - Si necesita que su receta se enve electrnicamente a una farmacia diferente, informe a nuestra oficina a travs de MyChart de [place]   o por telfono llamando al [phone] y presione la opcin 4.  

## 2022-04-17 NOTE — Progress Notes (Signed)
   Follow-Up Visit   Subjective  Jessica Moody is a 57 y.o. female who presents for the following: Botox for facial elastosis  The following portions of the chart were reviewed this encounter and updated as appropriate: medications, allergies, medical history  Review of Systems:  No other skin or systemic complaints except as noted in HPI or Assessment and Plan.  Objective  Well appearing patient in no apparent distress; mood and affect are within normal limits.  A focused examination was performed of the face.  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  Informed consent was obtained.  Preparation: The area was cleansed with alcohol.  Procedure Details:  Botox was injected into the dermis with a 30-gauge needle. Pressure applied to any bleeding. Ice packs offered for swelling.  Lot Number:  [id number] Expiration:  03/2024 Total Units Injected:  25  Plan: Tylenol may be used for headache.  Allow 2 weeks before returning to clinic for additional dosing as needed. Patient will call for any problems.  Return for 3-55m Botox.  I, Othelia Pulling, RMA, am acting as scribe for Brendolyn Patty, MD .   Documentation: I have reviewed the above documentation for accuracy and completeness, and I agree with the above.  Brendolyn Patty, MD

## 2022-04-24 DIAGNOSIS — R7309 Other abnormal glucose: Secondary | ICD-10-CM | POA: Diagnosis not present

## 2022-04-24 IMAGING — MG MM DIGITAL SCREENING BILAT W/ TOMO AND CAD
8 series · 8 of 24 positions shown · non-contrast
Comparison: Previous exam(s).

CLINICAL DATA: Screening.

EXAM:
DIGITAL SCREENING BILATERAL MAMMOGRAM WITH TOMOSYNTHESIS AND CAD
TECHNIQUE: Bilateral screening digital craniocaudal and mediolateral oblique
mammograms were obtained. Bilateral screening digital breast
tomosynthesis was performed. The images were evaluated with
computer-aided detection.

[R MLO synth-2D]
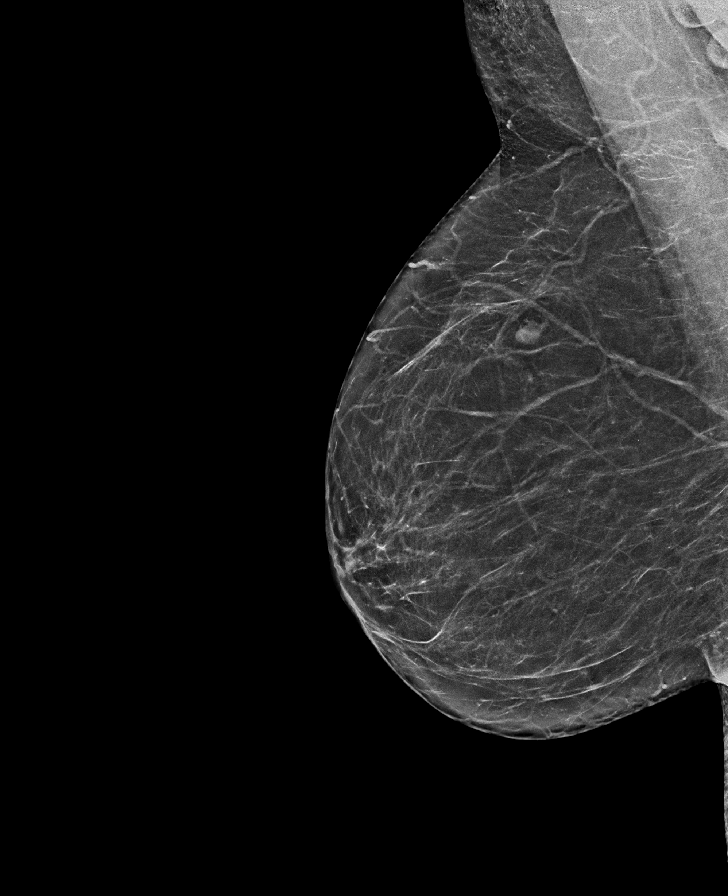

[L CC synth-2D]
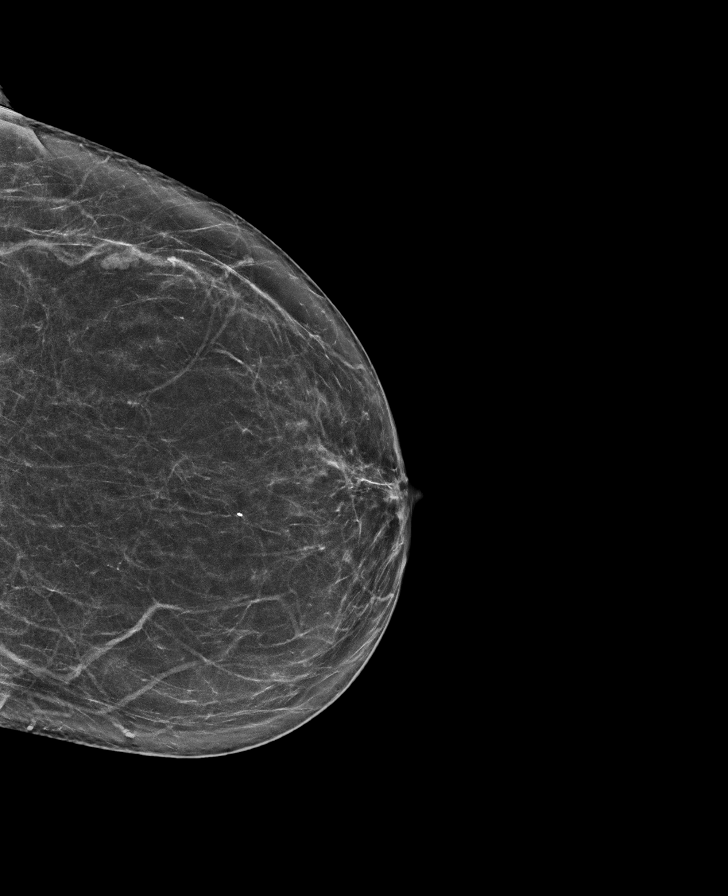

[R CC synth-2D]
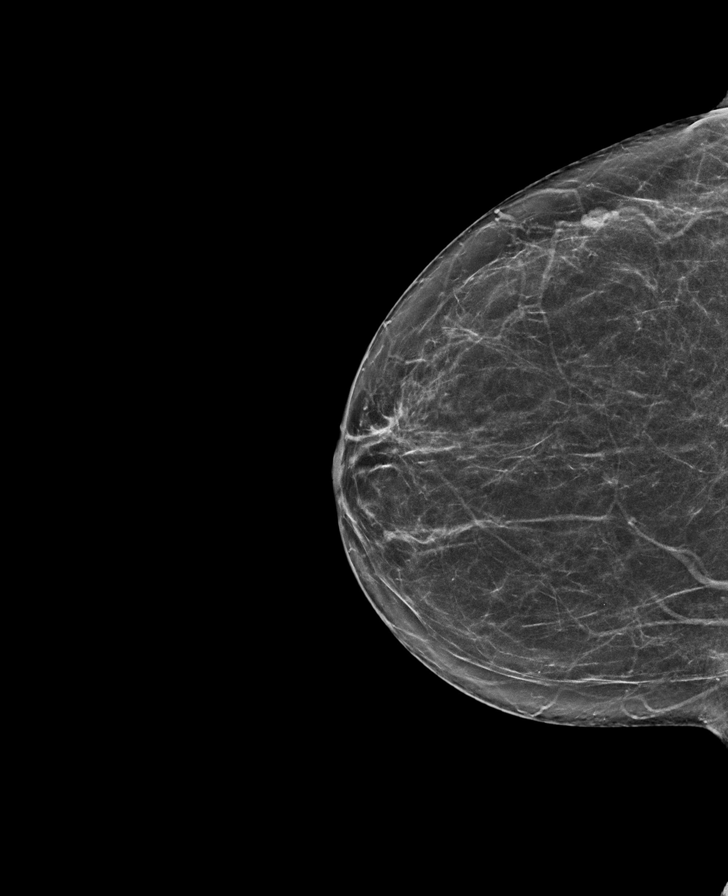

[L MLO synth-2D]
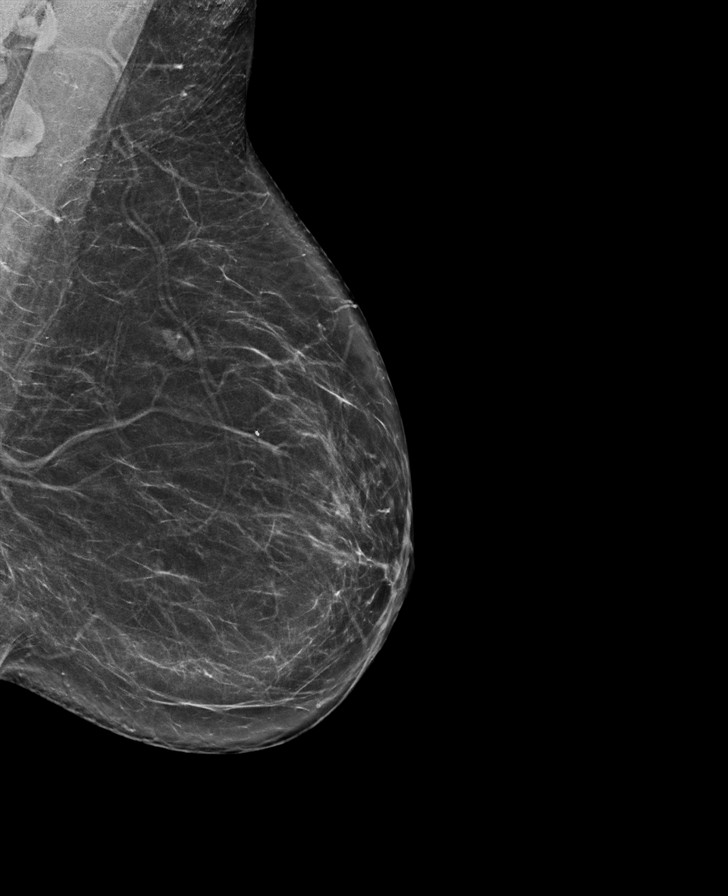

[R CC tomo · tomo slice 33/65.0]
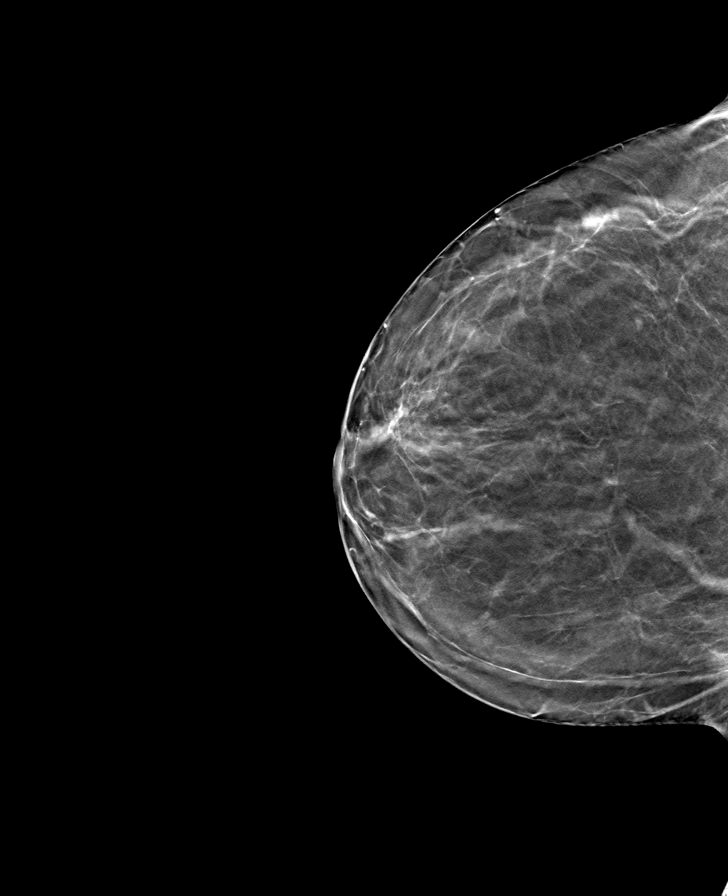

[L MLO tomo · tomo slice 39/76.0]
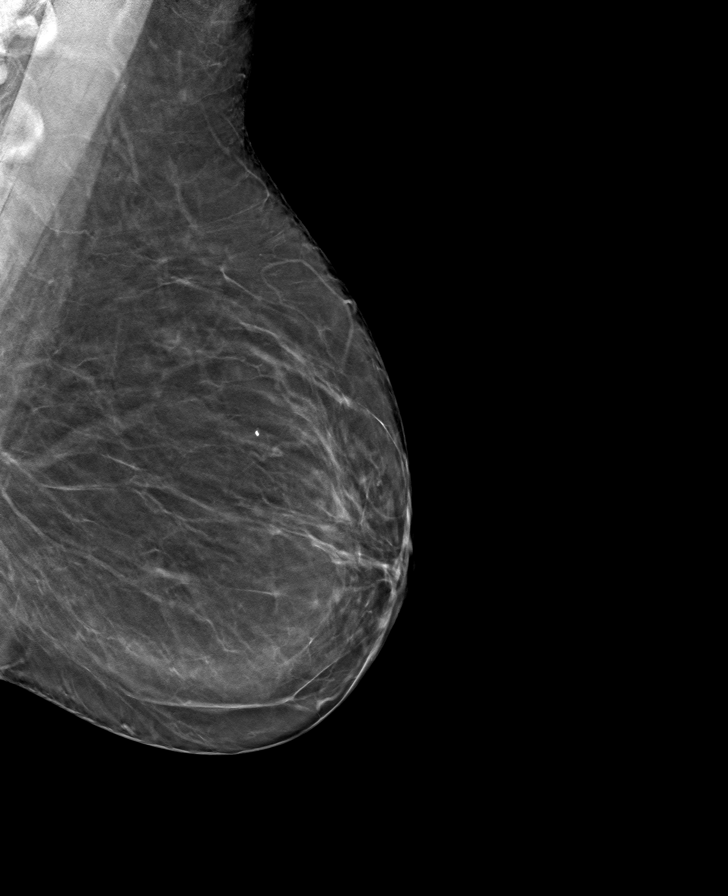

[L CC tomo · tomo slice 35/70.0]
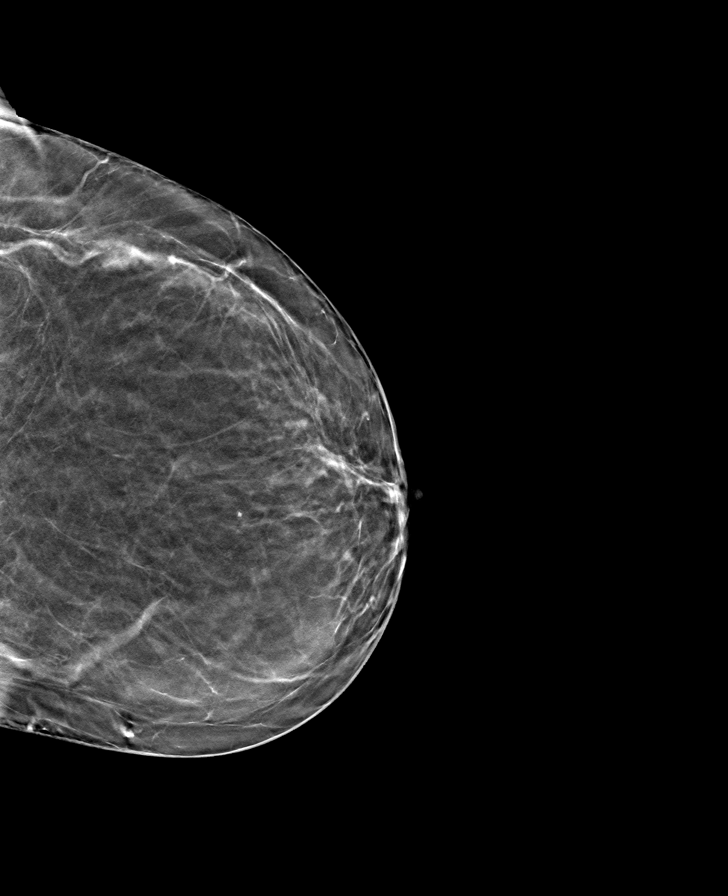

[R MLO tomo · tomo slice 35/70.0]
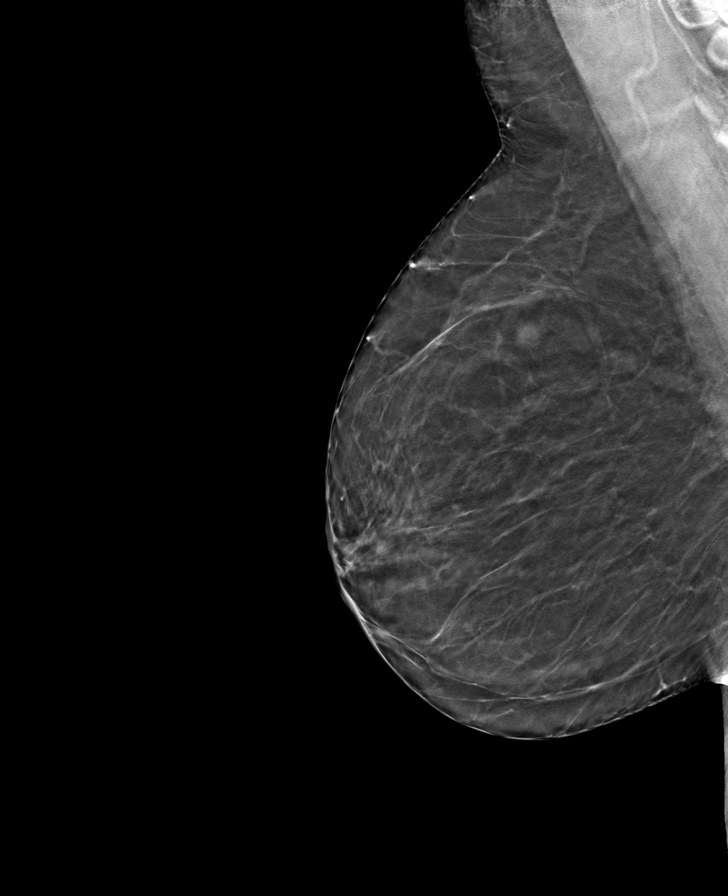

[8 of 24 positions shown; findings below may reference images not displayed]

ACR Breast Density Category b: There are scattered areas of
fibroglandular density.
FINDINGS: There are no findings suspicious for malignancy.
IMPRESSION: No mammographic evidence of malignancy. A result letter of this
screening mammogram will be mailed directly to the patient.

RECOMMENDATION:
Screening mammogram in one year. (Code:51-O-LD2)

BI-RADS CATEGORY  1: Negative.

## 2022-04-26 DIAGNOSIS — K219 Gastro-esophageal reflux disease without esophagitis: Secondary | ICD-10-CM | POA: Diagnosis not present

## 2022-04-26 DIAGNOSIS — E663 Overweight: Secondary | ICD-10-CM | POA: Diagnosis not present

## 2022-04-26 DIAGNOSIS — I4891 Unspecified atrial fibrillation: Secondary | ICD-10-CM | POA: Diagnosis not present

## 2022-04-26 DIAGNOSIS — I10 Essential (primary) hypertension: Secondary | ICD-10-CM | POA: Diagnosis not present

## 2022-04-28 DIAGNOSIS — F411 Generalized anxiety disorder: Secondary | ICD-10-CM | POA: Diagnosis not present

## 2022-05-17 DIAGNOSIS — F411 Generalized anxiety disorder: Secondary | ICD-10-CM | POA: Diagnosis not present

## 2022-05-24 DIAGNOSIS — R7309 Other abnormal glucose: Secondary | ICD-10-CM | POA: Diagnosis not present

## 2022-06-06 ENCOUNTER — Ambulatory Visit (INDEPENDENT_AMBULATORY_CARE_PROVIDER_SITE_OTHER): Payer: BC Managed Care – PPO | Admitting: Dermatology

## 2022-06-06 ENCOUNTER — Encounter: Payer: BC Managed Care – PPO | Admitting: Dermatology

## 2022-06-06 DIAGNOSIS — L988 Other specified disorders of the skin and subcutaneous tissue: Secondary | ICD-10-CM

## 2022-06-06 DIAGNOSIS — Z1283 Encounter for screening for malignant neoplasm of skin: Secondary | ICD-10-CM

## 2022-06-06 DIAGNOSIS — L814 Other melanin hyperpigmentation: Secondary | ICD-10-CM | POA: Diagnosis not present

## 2022-06-06 DIAGNOSIS — L905 Scar conditions and fibrosis of skin: Secondary | ICD-10-CM | POA: Diagnosis not present

## 2022-06-06 DIAGNOSIS — D225 Melanocytic nevi of trunk: Secondary | ICD-10-CM

## 2022-06-06 DIAGNOSIS — X32XXXA Exposure to sunlight, initial encounter: Secondary | ICD-10-CM

## 2022-06-06 DIAGNOSIS — L853 Xerosis cutis: Secondary | ICD-10-CM

## 2022-06-06 DIAGNOSIS — D229 Melanocytic nevi, unspecified: Secondary | ICD-10-CM

## 2022-06-06 DIAGNOSIS — D220 Melanocytic nevi of lip: Secondary | ICD-10-CM

## 2022-06-06 DIAGNOSIS — L578 Other skin changes due to chronic exposure to nonionizing radiation: Secondary | ICD-10-CM

## 2022-06-06 DIAGNOSIS — W908XXA Exposure to other nonionizing radiation, initial encounter: Secondary | ICD-10-CM

## 2022-06-06 DIAGNOSIS — L821 Other seborrheic keratosis: Secondary | ICD-10-CM

## 2022-06-06 NOTE — Patient Instructions (Addendum)
Basic OTC daily skin care regimen to prevent photoaging:   Recommend facial moisturizer with sunscreen SPF 30 every morning (OTC brands include CeraVe AM, Neutrogena, Eucerin, Cetaphil, Aveeno, La Roche Posay).  Can also apply a topical Vit C serum which is an antioxidant (OTC brands include CeraVe, La Roche Posay, and The Ordinary) underneath sunscreen in morning. If you are outside during the day in the summer for extended periods, especially swimming and/or sweating, make sure you apply a water resistant facial sunscreen lotion spf 30 or higher.   At night recommend a cream with retinol (a vitamin A derivative which stimulates collagen production) like CeraVe skin renewing retinol serum or ROC retinol correxion cream or Neutrogena rapid wrinkle repair cream. Retinol may cause skin irritation in people with sensitive skin.  Can use it every other day and/or apply on top of a hyaluronic acid (HA) moisturizer/serum (Neutrogena Hydroboost water cream) if better tolerated that way.  Retinol may also help with lightening brown spots.   Our office sells high quality, medically tested skin care lines such as Elta MD sunscreens (with Zinc), and Alastin skin care products, which are very effective in treating photoaging. The Alastin line includes cosmeceutical grade Vit.C serum, HA serum, Elastin stimulating moisturizers/serums, lightening serum, and sunscreens.  If you want prescription treatment, then you would need an appointment (Rx tretinoin and fade creams, Botox, filler injections, laser treatments, etc.) These prescriptions and procedures are not covered by insurance but work very well.     To treat freckling at face at cheeks  Counseling for BBL / IPL / Laser and Coordination of Care Discussed the treatment option of Broad Band Light (BBL) /Intense Pulsed Light (IPL)/ Laser for skin discoloration, including brown spots and redness.  Typically we recommend at least 1-3 treatment sessions about 5-8  weeks apart for best results.  Cannot have tanned skin when BBL performed, and regular use of sunscreen is advised after the procedure to help maintain results. The patient's condition may also require "maintenance treatments" in the future.  The fee for BBL / laser treatments is $350 per treatment session for the whole face.  A fee can be quoted for other parts of the body.  Insurance typically does not pay for BBL/laser treatments and therefore the fee is an out-of-pocket cost.  Gentle Skin Care Guide  1. Bathe no more than once a day.  2. Avoid bathing in hot water  3. Use a mild soap like Dove, Vanicream, Cetaphil, CeraVe. Can use Lever 2000 or Cetaphil antibacterial soap  4. Use soap only where you need it. On most days, use it under your arms, between your legs, and on your feet. Let the water rinse other areas unless visibly dirty.  5. When you get out of the bath/shower, use a towel to gently blot your skin dry, don't rub it.  6. While your skin is still a little damp, apply a moisturizing cream such as Vanicream, CeraVe, Cetaphil, Eucerin, Sarna lotion or plain Vaseline Jelly. For hands apply Neutrogena [place] Hand Cream or Excipial Hand Cream.  7. Reapply moisturizer any time you start to itch or feel dry.  8. Sometimes using free and clear laundry detergents can be helpful. Fabric softener sheets should be avoided. Downy Free & Gentle liquid, or any liquid fabric softener that is free of dyes and perfumes, it acceptable to use  9. If your doctor has given you prescription creams you may apply moisturizers over them      Seborrheic Keratosis  What causes seborrheic keratoses? Seborrheic keratoses are harmless, common skin growths that first appear during adult life.  As time goes by, more growths appear.  Some people may develop a large number of them.  Seborrheic keratoses appear on both covered and uncovered body parts.  They are not caused by sunlight.  The tendency to  develop seborrheic keratoses can be inherited.  They vary in color from skin-colored to gray, brown, or even black.  They can be either smooth or have a rough, warty surface.   Seborrheic keratoses are superficial and look as if they were stuck on the skin.  Under the microscope this type of keratosis looks like layers upon layers of skin.  That is why at times the top layer may seem to fall off, but the rest of the growth remains and re-grows.    Treatment Seborrheic keratoses do not need to be treated, but can easily be removed in the office.  Seborrheic keratoses often cause symptoms when they rub on clothing or jewelry.  Lesions can be in the way of shaving.  If they become inflamed, they can cause itching, soreness, or burning.  Removal of a seborrheic keratosis can be accomplished by freezing, burning, or surgery. If any spot bleeds, scabs, or grows rapidly, please return to have it checked, as these can be an indication of a skin cancer.      Melanoma ABCDEs  Melanoma is the most dangerous type of skin cancer, and is the leading cause of death from skin disease.  You are more likely to develop melanoma if you: Have light-colored skin, light-colored eyes, or red or blond hair Spend a lot of time in the sun Tan regularly, either outdoors or in a tanning bed Have had blistering sunburns, especially during childhood Have a close family member who has had a melanoma Have atypical moles or large birthmarks  Early detection of melanoma is key since treatment is typically straightforward and cure rates are extremely high if we catch it early.   The first sign of melanoma is often a change in a mole or a new dark spot.  The ABCDE system is a way of remembering the signs of melanoma.  A for asymmetry:  The two halves do not match. B for border:  The edges of the growth are irregular. C for color:  A mixture of colors are present instead of an even brown color. D for diameter:  Melanomas are  usually (but not always) greater than 6mm - the size of a pencil eraser. E for evolution:  The spot keeps changing in size, shape, and color.  Please check your skin once per month between visits. You can use a small mirror in front and a large mirror behind you to keep an eye on the back side or your body.   If you see any new or changing lesions before your next follow-up, please call to schedule a visit.  Please continue daily skin protection including broad spectrum sunscreen SPF 30+ to sun-exposed areas, reapplying every 2 hours as needed when you're outdoors.   Staying in the shade or wearing long sleeves, sun glasses (UVA+UVB protection) and wide brim hats (4-inch brim around the entire circumference of the hat) are also recommended for sun protection.    Due to recent changes in healthcare laws, you may see results of your pathology and/or laboratory studies on MyChart before the doctors have had a chance to review them. We understand that in some cases there  may be results that are confusing or concerning to you. Please understand that not all results are received at the same time and often the doctors may need to interpret multiple results in order to provide you with the best plan of care or course of treatment. Therefore, we ask that you please give [place] 2 business days to thoroughly review all your results before contacting the office for clarification. Should we see a critical lab result, you will be contacted sooner.   If You Need Anything After Your Visit  If you have any questions or concerns for your doctor, please call our main line at [phone] and press option 4 to reach your doctor's medical assistant. If no one answers, please leave a voicemail as directed and we will return your call as soon as possible. Messages left after 4 pm will be answered the following business day.   You may also send [place] a message via MyChart. We typically respond to MyChart messages within 1-2  business days.  For prescription refills, please ask your pharmacy to contact our office. Our fax number is [phone].  If you have an urgent issue when the clinic is closed that cannot wait until the next business day, you can page your doctor at the number below.    Please note that while we do our best to be available for urgent issues outside of office hours, we are not available 24/7.   If you have an urgent issue and are unable to reach [place], you may choose to seek medical care at your doctor's office, retail clinic, urgent care center, or emergency room.  If you have a medical emergency, please immediately call 911 or go to the emergency department.  Pager Numbers  - Dr. Gwen Pounds: [phone]  - Dr. Neale Burly: [phone]  - Dr. Roseanne Reno: [phone]  In the event of inclement weather, please call our main line at [phone] for an update on the status of any delays or closures.  Dermatology Medication Tips: Please keep the boxes that topical medications come in in order to help keep track of the instructions about where and how to use these. Pharmacies typically print the medication instructions only on the boxes and not directly on the medication tubes.   If your medication is too expensive, please contact our office at [phone] option 4 or send [place] a message through MyChart.   We are unable to tell what your co-pay for medications will be in advance as this is different depending on your insurance coverage. However, we may be able to find a substitute medication at lower cost or fill out paperwork to get insurance to cover a needed medication.   If a prior authorization is required to get your medication covered by your insurance company, please allow [place] 1-2 business days to complete this process.  Drug prices often vary depending on where the prescription is filled and some pharmacies may offer cheaper prices.  The website www.goodrx.com contains coupons for medications  through different pharmacies. The prices here do not account for what the cost may be with help from insurance (it may be cheaper with your insurance), but the website can give you the price if you did not use any insurance.  - You can print the associated coupon and take it with your prescription to the pharmacy.  - You may also stop by our office during regular business hours and pick up a GoodRx coupon card.  - If you need your prescription sent electronically to  a different pharmacy, notify our office through [facility] or by phone at [phone] option 4.     Si Usted Necesita Algo Despus de Su Visita  Tambin puede enviarnos un mensaje a travs de Clinical cytogeneticist. Por lo general respondemos a los mensajes de MyChart en el transcurso de 1 a 2 das hbiles.  Para renovar recetas, por favor pida a su farmacia que se ponga en contacto con nuestra oficina. Annie Sable de fax es [place] [phone].  Si tiene un asunto urgente cuando la clnica est cerrada y que no puede esperar hasta el siguiente da hbil, puede llamar/localizar a su doctor(a) al nmero que aparece a continuacin.   Por favor, tenga en cuenta que aunque hacemos todo lo posible para estar disponibles para asuntos urgentes fuera del horario de [place], no estamos disponibles las 24 horas del da, los 7 [address]  [address] de la [place].   Si tiene un problema urgente y no puede comunicarse con nosotros, puede optar por buscar atencin mdica  en el consultorio de su doctor(a), en una clnica privada, en un centro de atencin urgente o en una sala de emergencias.  Si tiene Engineer, drilling, por favor llame inmediatamente al 911 o vaya a la sala de emergencias.  Nmeros de bper  - Dr. Gwen Pounds: [phone]  - Dra. Moye: [phone]  - Dra. Roseanne Reno: [phone]  En caso de inclemencias del [place], por favor llame a Lacy Duverney principal al [phone] para una actualizacin sobre el [place] de cualquier retraso o  cierre.  Consejos para la medicacin en dermatologa: Por favor, guarde las cajas en las que vienen los medicamentos de uso tpico para ayudarle a seguir las instrucciones sobre dnde y cmo usarlos. Las farmacias generalmente imprimen las instrucciones del medicamento slo en las cajas y no directamente en los tubos del [place].   Si su medicamento es muy caro, por favor, pngase en contacto con Rolm [place]ala llamando al [phone] y presione la opcin 4 o envenos un mensaje a travs de Clinical cytogeneticist.   No podemos decirle cul ser su copago por los medicamentos por adelantado ya que esto es diferente dependiendo de la cobertura de su seguro. Sin embargo, es posible que podamos encontrar un medicamento sustituto a Audiological scientist un formulario para que el seguro cubra el medicamento que se considera necesario.   Si se requiere una autorizacin previa para que su compaa de seguros [place] su medicamento, por favor permtanos de 1 a 2 das hbiles para completar [address]  Los precios de los medicamentos varan con frecuencia dependiendo del Environmental consultant de dnde se surte la receta y alguna farmacias pueden ofrecer precios ms baratos.  El sitio web www.goodrx.com tiene cupones para medicamentos de Health and safety inspector. Los precios aqu no tienen en cuenta lo que podra costar con la ayuda del seguro (puede ser ms barato con su seguro), pero el sitio web puede darle el precio si no utiliz Tourist information centre manager.  - Puede imprimir el cupn correspondiente y llevarlo con su receta a la farmacia.  - Tambin puede pasar por nuestra oficina durante el horario de atencin regular y Education officer, museum una tarjeta de cupones de GoodRx.  - Si necesita que su receta se enve electrnicamente a una farmacia diferente, informe a nuestra oficina a travs de MyChart de [place] o por telfono llamando al [phone] y presione la opcin 4.

## 2022-06-06 NOTE — Progress Notes (Signed)
Follow-Up Visit   Subjective  Jessica Moody is a 57 y.o. female who presents for the following: Skin Cancer Screening and Full Body Skin Exam Hx of isks, reports some brown spots at face she would like to discuss treatment for.    The patient presents for Total-Body Skin Exam (TBSE) for skin cancer screening and mole check. The patient has spots, moles and lesions to be evaluated, some may be new or changing and the patient has concerns that these could be cancer.    The following portions of the chart were reviewed this encounter and updated as appropriate: medications, allergies, medical history  Review of Systems:  No other skin or systemic complaints except as noted in HPI or Assessment and Plan.  Objective  Well appearing patient in no apparent distress; mood and affect are within normal limits.  A full examination was performed including scalp, head, eyes, ears, nose, lips, neck, chest, axillae, abdomen, back, buttocks, bilateral upper extremities, bilateral lower extremities, hands, feet, fingers, toes, fingernails, and toenails. All findings within normal limits unless otherwise noted below.   Relevant physical exam findings are noted in the Assessment and Plan.  b/l cheeks  scattered tan macules    face Rhytides and volume loss.     Assessment & Plan   LENTIGINES, SEBORRHEIC KERATOSES, HEMANGIOMAS - Benign normal skin lesions - Benign-appearing - Call for any changes Sk at left upper eyelid    Scar Left Shoulder   From rotator cuff surgery   Benign. Observe.    MELANOCYTIC NEVI - Tan-brown and/or pink-flesh-colored symmetric macules and papules - Benign appearing on exam today - Observation - Call clinic for new or changing moles - Recommend daily use of broad spectrum spf 30+ sunscreen to sun-exposed areas.  Nevus (2)  Right Upper Lip 6.44mm flesh papule, discussed shave removal if irritated   R umbilicus 2.61mm med dark brown macule    Benign-appearing. Stable compared to previous visit. Observation.  Call clinic for new or changing moles.  Recommend daily use of broad spectrum spf 30+ sunscreen to sun-exposed areas.   Xerosis - diffuse xerotic patches - recommend gentle, hydrating skin care - gentle skin care handout given   ACTINIC DAMAGE - Chronic condition, secondary to cumulative UV/sun exposure - diffuse scaly erythematous macules with underlying dyspigmentation - Recommend daily broad spectrum sunscreen SPF 30+ to sun-exposed areas, reapply every 2 hours as needed.  - Staying in the shade or wearing long sleeves, sun glasses (UVA+UVB protection) and wide brim hats (4-inch brim around the entire circumference of the hat) are also recommended for sun protection.  - Call for new or changing lesions.  SKIN CANCER SCREENING PERFORMED TODAY.    Lentigines b/l cheeks  Benign. Observe  Patient has history of melasma in same area, but clear today   Counseling for BBL / IPL / Laser and Coordination of Care Discussed the treatment option of Broad Band Light (BBL) /Intense Pulsed Light (IPL)/ Laser for skin discoloration, including brown spots and redness.  Typically we recommend at least 1-3 treatment sessions about 5-8 weeks apart for best results.  Cannot have tanned skin when BBL performed, and regular use of sunscreen is advised after the procedure to help maintain results. The patient's condition may also require "maintenance treatments" in the future.  The fee for BBL / laser treatments is $350 per treatment session for the whole face.  A fee can be quoted for other parts of the body.  Insurance typically does not  pay for BBL/laser treatments and therefore the fee is an out-of-pocket cost.   Elastosis of skin face  Patient bothered by wrinkling at Botox Comma   Discussed botox comma inject at next botox followup also may need 2.5 units for BL botox comma  Along with her normal pattern at  Frown complex,  forehead - Frown complex 20 units - Forehead 5 units -add 2.5 units for BL comma  Basic OTC daily skin care regimen to prevent photoaging handout provided    Return in 1 year (on 06/06/2023) for TBSE.  I, Asher Muir, CMA, am acting as scribe for Willeen Niece, MD.   Documentation: I have reviewed the above documentation for accuracy and completeness, and I agree with the above.  Willeen Niece, MD

## 2022-06-15 DIAGNOSIS — F411 Generalized anxiety disorder: Secondary | ICD-10-CM | POA: Diagnosis not present

## 2022-06-20 DIAGNOSIS — R635 Abnormal weight gain: Secondary | ICD-10-CM | POA: Diagnosis not present

## 2022-06-20 DIAGNOSIS — K219 Gastro-esophageal reflux disease without esophagitis: Secondary | ICD-10-CM | POA: Diagnosis not present

## 2022-06-20 DIAGNOSIS — Z9884 Bariatric surgery status: Secondary | ICD-10-CM | POA: Diagnosis not present

## 2022-06-24 DIAGNOSIS — R7309 Other abnormal glucose: Secondary | ICD-10-CM | POA: Diagnosis not present

## 2022-06-29 DIAGNOSIS — F411 Generalized anxiety disorder: Secondary | ICD-10-CM | POA: Diagnosis not present

## 2022-07-11 DIAGNOSIS — F411 Generalized anxiety disorder: Secondary | ICD-10-CM | POA: Diagnosis not present

## 2022-07-24 DIAGNOSIS — R7309 Other abnormal glucose: Secondary | ICD-10-CM | POA: Diagnosis not present

## 2022-07-26 DIAGNOSIS — F411 Generalized anxiety disorder: Secondary | ICD-10-CM | POA: Diagnosis not present

## 2022-07-28 ENCOUNTER — Other Ambulatory Visit: Payer: Self-pay

## 2022-07-28 MED ORDER — LOSARTAN POTASSIUM 25 MG PO TABS: 25.0000 mg | ORAL_TABLET | Freq: Every day | ORAL | 1 refills | Status: DC

## 2022-08-07 ENCOUNTER — Ambulatory Visit (INDEPENDENT_AMBULATORY_CARE_PROVIDER_SITE_OTHER): Payer: Self-pay | Admitting: Dermatology

## 2022-08-07 DIAGNOSIS — L988 Other specified disorders of the skin and subcutaneous tissue: Secondary | ICD-10-CM

## 2022-08-07 NOTE — Patient Instructions (Signed)

## 2022-08-07 NOTE — Progress Notes (Signed)
   Follow-Up Visit   Subjective  KIMM SIDER is a 57 y.o. female who presents for the following: Botox for facial elastosis  The following portions of the chart were reviewed this encounter and updated as appropriate: medications, allergies, medical history  Review of Systems:  No other skin or systemic complaints except as noted in HPI or Assessment and Plan.  Objective  Well appearing patient in no apparent distress; mood and affect are within normal limits.  A focused examination was performed of the face.  Relevant physical exam findings are noted in the Assessment and Plan.  Injection site map     Assessment & Plan    Facial Elastosis  Location: See attached image  Informed consent: Discussed risks (infection, pain, bleeding, bruising, swelling, allergic reaction, paralysis of nearby muscles, eyelid droop, double vision, neck weakness, difficulty breathing, headache, undesirable cosmetic result, and need for additional treatment) and benefits of the procedure, as well as the alternatives.  Informed consent was obtained.  Preparation: The area was cleansed with alcohol.  Procedure Details:  Botox was injected into the dermis with a 30-gauge needle. Pressure applied to any bleeding. Ice packs offered for swelling.  Lot Number:  [id number]  Expiration:  06/2024  Total Units Injected:  27.5 - Frown complex 20 units - Forehead 5 units - 2.5 units for BL comma (1.25 units each side)  Plan: Tylenol may be used for headache.  Allow 2 weeks before returning to clinic for additional dosing as needed. Patient will call for any problems.  Return 3-4 months, for Botox.  ICherlyn Labella, CMA, am acting as scribe for Willeen Niece, MD .   Documentation: I have reviewed the above documentation for accuracy and completeness, and I agree with the above.  Willeen Niece, MD

## 2022-08-08 DIAGNOSIS — Z8639 Personal history of other endocrine, nutritional and metabolic disease: Secondary | ICD-10-CM | POA: Diagnosis not present

## 2022-08-08 DIAGNOSIS — I10 Essential (primary) hypertension: Secondary | ICD-10-CM | POA: Diagnosis not present

## 2022-08-08 DIAGNOSIS — R635 Abnormal weight gain: Secondary | ICD-10-CM | POA: Diagnosis not present

## 2022-08-08 DIAGNOSIS — K219 Gastro-esophageal reflux disease without esophagitis: Secondary | ICD-10-CM | POA: Diagnosis not present

## 2022-08-14 ENCOUNTER — Ambulatory Visit: Payer: BC Managed Care – PPO | Admitting: Dermatology

## 2022-08-24 DIAGNOSIS — R7309 Other abnormal glucose: Secondary | ICD-10-CM | POA: Diagnosis not present

## 2022-09-19 DIAGNOSIS — I10 Essential (primary) hypertension: Secondary | ICD-10-CM | POA: Diagnosis not present

## 2022-09-19 DIAGNOSIS — I480 Paroxysmal atrial fibrillation: Secondary | ICD-10-CM | POA: Diagnosis not present

## 2022-09-19 DIAGNOSIS — K432 Incisional hernia without obstruction or gangrene: Secondary | ICD-10-CM | POA: Diagnosis not present

## 2022-09-19 DIAGNOSIS — K219 Gastro-esophageal reflux disease without esophagitis: Secondary | ICD-10-CM | POA: Diagnosis not present

## 2022-09-24 DIAGNOSIS — R7309 Other abnormal glucose: Secondary | ICD-10-CM | POA: Diagnosis not present

## 2022-10-24 DIAGNOSIS — R7309 Other abnormal glucose: Secondary | ICD-10-CM | POA: Diagnosis not present

## 2022-11-10 DIAGNOSIS — K439 Ventral hernia without obstruction or gangrene: Secondary | ICD-10-CM | POA: Diagnosis not present

## 2022-11-14 DIAGNOSIS — Z8639 Personal history of other endocrine, nutritional and metabolic disease: Secondary | ICD-10-CM | POA: Diagnosis not present

## 2022-11-14 DIAGNOSIS — I10 Essential (primary) hypertension: Secondary | ICD-10-CM | POA: Diagnosis not present

## 2022-11-14 DIAGNOSIS — K219 Gastro-esophageal reflux disease without esophagitis: Secondary | ICD-10-CM | POA: Diagnosis not present

## 2022-11-14 DIAGNOSIS — R635 Abnormal weight gain: Secondary | ICD-10-CM | POA: Diagnosis not present

## 2022-11-24 DIAGNOSIS — R7309 Other abnormal glucose: Secondary | ICD-10-CM | POA: Diagnosis not present

## 2022-11-25 DIAGNOSIS — J029 Acute pharyngitis, unspecified: Secondary | ICD-10-CM | POA: Diagnosis not present

## 2022-12-04 DIAGNOSIS — K219 Gastro-esophageal reflux disease without esophagitis: Secondary | ICD-10-CM | POA: Diagnosis not present

## 2022-12-04 DIAGNOSIS — I10 Essential (primary) hypertension: Secondary | ICD-10-CM | POA: Diagnosis not present

## 2022-12-04 DIAGNOSIS — R635 Abnormal weight gain: Secondary | ICD-10-CM | POA: Diagnosis not present

## 2022-12-04 DIAGNOSIS — I480 Paroxysmal atrial fibrillation: Secondary | ICD-10-CM | POA: Diagnosis not present

## 2022-12-04 DIAGNOSIS — K439 Ventral hernia without obstruction or gangrene: Secondary | ICD-10-CM | POA: Diagnosis not present

## 2022-12-11 DIAGNOSIS — H903 Sensorineural hearing loss, bilateral: Secondary | ICD-10-CM | POA: Diagnosis not present

## 2022-12-11 DIAGNOSIS — H6982 Other specified disorders of Eustachian tube, left ear: Secondary | ICD-10-CM | POA: Diagnosis not present

## 2022-12-11 DIAGNOSIS — R070 Pain in throat: Secondary | ICD-10-CM | POA: Diagnosis not present

## 2022-12-18 ENCOUNTER — Encounter: Payer: Self-pay | Admitting: Dermatology

## 2022-12-18 ENCOUNTER — Ambulatory Visit (INDEPENDENT_AMBULATORY_CARE_PROVIDER_SITE_OTHER): Payer: Self-pay | Admitting: Dermatology

## 2022-12-18 DIAGNOSIS — L988 Other specified disorders of the skin and subcutaneous tissue: Secondary | ICD-10-CM

## 2022-12-18 NOTE — Patient Instructions (Signed)

## 2022-12-18 NOTE — Progress Notes (Signed)
   Follow-Up Visit   Subjective  Jessica Moody is a 57 y.o. female who presents for the following: Botox for facial elastosis  The following portions of the chart were reviewed this encounter and updated as appropriate: medications, allergies, medical history  Review of Systems:  No other skin or systemic complaints except as noted in HPI or Assessment and Plan.  Objective  Well appearing patient in no apparent distress; mood and affect are within normal limits.  A focused examination was performed of the face.  Relevant physical exam findings are noted in the Assessment and Plan.  Injection map photo    Assessment & Plan    Facial Elastosis Botox 30 units injected today to: - Frown complex 20 units - Forehead 7.5 units (increased from 5 units to 7.5 units) - Botox comma 1.25 x 2  Location: frown complex, forehead, botox commas  Informed consent: Discussed risks (infection, pain, bleeding, bruising, swelling, allergic reaction, paralysis of nearby muscles, eyelid droop, double vision, neck weakness, difficulty breathing, headache, undesirable cosmetic result, and need for additional treatment) and benefits of the procedure, as well as the alternatives.  Informed consent was obtained.  Preparation: The area was cleansed with alcohol.  Procedure Details:  Botox was injected into the dermis with a 30-gauge needle. Pressure applied to any bleeding. Ice packs offered for swelling.  Lot Number:  [id number] Expiration:  09/2024  Total Units Injected:  30  Plan: Tylenol may be used for headache.  Allow 2 weeks before returning to clinic for additional dosing as needed. Patient will call for any problems.  Return for 3-49m Botox.  I, Ardis Rowan, RMA, am acting as scribe for Willeen Niece, MD .   Documentation: I have reviewed the above documentation for accuracy and completeness, and I agree with the above.  Willeen Niece, MD

## 2022-12-24 DIAGNOSIS — R7309 Other abnormal glucose: Secondary | ICD-10-CM | POA: Diagnosis not present

## 2022-12-29 DIAGNOSIS — K439 Ventral hernia without obstruction or gangrene: Secondary | ICD-10-CM | POA: Diagnosis not present

## 2023-01-08 ENCOUNTER — Other Ambulatory Visit: Payer: Self-pay

## 2023-01-08 MED ORDER — LOSARTAN POTASSIUM 25 MG PO TABS: 25.0000 mg | ORAL_TABLET | Freq: Every day | ORAL | 0 refills | Status: DC

## 2023-01-24 DIAGNOSIS — R7309 Other abnormal glucose: Secondary | ICD-10-CM | POA: Diagnosis not present

## 2023-02-13 DIAGNOSIS — I10 Essential (primary) hypertension: Secondary | ICD-10-CM | POA: Diagnosis not present

## 2023-02-13 DIAGNOSIS — I480 Paroxysmal atrial fibrillation: Secondary | ICD-10-CM | POA: Diagnosis not present

## 2023-02-13 DIAGNOSIS — Z8639 Personal history of other endocrine, nutritional and metabolic disease: Secondary | ICD-10-CM | POA: Diagnosis not present

## 2023-02-13 DIAGNOSIS — K219 Gastro-esophageal reflux disease without esophagitis: Secondary | ICD-10-CM | POA: Diagnosis not present

## 2023-02-24 DIAGNOSIS — R7309 Other abnormal glucose: Secondary | ICD-10-CM | POA: Diagnosis not present

## 2023-03-06 ENCOUNTER — Other Ambulatory Visit: Payer: Self-pay

## 2023-03-06 MED ORDER — METOPROLOL SUCCINATE ER 50 MG PO TB24: 50.0000 mg | ORAL_TABLET | Freq: Every day | ORAL | 0 refills | Status: DC

## 2023-03-12 ENCOUNTER — Ambulatory Visit: Payer: BC Managed Care – PPO | Admitting: Cardiology

## 2023-03-13 ENCOUNTER — Ambulatory Visit: Payer: BC Managed Care – PPO | Admitting: Physician Assistant

## 2023-03-15 NOTE — Progress Notes (Deleted)
  Electrophysiology Office Note:   Date:  03/15/2023  ID:  Jessica, Moody 1965/02/21, MRN [id number]  Primary Cardiologist: None Electrophysiologist: Will Jorja Loa, MD  {Click to update primary MD,subspecialty MD or APP then REFRESH:1}    History of Present Illness:   Jessica Moody is a 58 y.o. female with h/o PAF, HTN, HLD, paroxysmal AF, and HFrecEF seen today for routine electrophysiology followup.   Since last being seen in our clinic the patient reports doing ***.  she denies chest pain, palpitations, dyspnea, PND, orthopnea, nausea, vomiting, dizziness, syncope, edema, weight gain, or early satiety.   Review of systems complete and found to be negative unless listed in HPI.   EP Information / Studies Reviewed:    EKG is ordered today. Personal review as below.       Arrhythmia/Device History No specialty comments available.   Physical Exam:   VS:  LMP 09/29/2015    Wt Readings from Last 3 Encounters:  02/09/22 170 lb (77.1 kg)  02/02/21 197 lb 6.4 oz (89.5 kg)  01/22/20 199 lb (90.3 kg)     GEN: No acute distress NECK: No JVD; No carotid bruits CARDIAC: {EPRHYTHM:28826}, no murmurs, rubs, gallops RESPIRATORY:  Clear to auscultation without rales, wheezing or rhonchi  ABDOMEN: Soft, non-tender, non-distended EXTREMITIES:  {EDEMA LEVEL:28147::"No"} edema; No deformity   ASSESSMENT AND PLAN:    Paroxysmal atrial fibrillation EKG today shows *** Not on OAC.  CHA2DS2/VASc is 2. (HTN and female). 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     {Click here to Review PMH, Prob List, Meds, Allergies, SHx, FHx  :1}   Follow up with {WUJWJ:19147} {EPFOLLOW WG:95621}  Signed, Graciella Freer, PA-C

## 2023-03-16 ENCOUNTER — Ambulatory Visit: Payer: BC Managed Care – PPO | Attending: Student | Admitting: Student

## 2023-03-16 DIAGNOSIS — I5032 Chronic diastolic (congestive) heart failure: Secondary | ICD-10-CM

## 2023-03-16 DIAGNOSIS — I480 Paroxysmal atrial fibrillation: Secondary | ICD-10-CM

## 2023-03-16 DIAGNOSIS — I10 Essential (primary) hypertension: Secondary | ICD-10-CM

## 2023-03-19 ENCOUNTER — Encounter: Payer: Self-pay | Admitting: Student

## 2023-03-24 DIAGNOSIS — R7309 Other abnormal glucose: Secondary | ICD-10-CM | POA: Diagnosis not present

## 2023-03-26 ENCOUNTER — Encounter: Payer: Self-pay | Admitting: Dermatology

## 2023-03-26 ENCOUNTER — Ambulatory Visit (INDEPENDENT_AMBULATORY_CARE_PROVIDER_SITE_OTHER): Payer: Self-pay | Admitting: Dermatology

## 2023-03-26 DIAGNOSIS — L988 Other specified disorders of the skin and subcutaneous tissue: Secondary | ICD-10-CM

## 2023-03-26 NOTE — Patient Instructions (Signed)

## 2023-03-26 NOTE — Progress Notes (Signed)
   Follow-Up Visit   Subjective  Jessica Moody is a 58 y.o. female who presents for the following: Botox for facial elastosis  The following portions of the chart were reviewed this encounter and updated as appropriate: medications, allergies, medical history  Review of Systems:  No other skin or systemic complaints except as noted in HPI or Assessment and Plan.  Objective  Well appearing patient in no apparent distress; mood and affect are within normal limits.  A focused examination was performed of the face.  Relevant physical exam findings are noted in the Assessment and Plan.  Injection map photo     Assessment & Plan    Facial Elastosis Botox 30 units injected today to: - Frown complex 20 units - Forehead 7.5 units - Botox comma 1.25 x 2  Discussed adding additional 2.5 units to upper forehead.  Location: frown complex, forehead, Botox commas  Informed consent: Discussed risks (infection, pain, bleeding, bruising, swelling, allergic reaction, paralysis of nearby muscles, eyelid droop, double vision, neck weakness, difficulty breathing, headache, undesirable cosmetic result, and need for additional treatment) and benefits of the procedure, as well as the alternatives.  Informed consent was obtained.  Preparation: The area was cleansed with alcohol.  Procedure Details:  Botox was injected into the dermis with a 30-gauge needle. Pressure applied to any bleeding. Ice packs offered for swelling.  Lot Number:  [id number] Expiration:  03/2025  Total Units Injected:  30   Plan: Tylenol may be used for headache.  Allow 2 weeks before returning to clinic for additional dosing as needed. Patient will call for any problems.  Return for 3-63m Botox.  I, Ardis Rowan, RMA, am acting as scribe for Willeen Niece, MD .   Documentation: I have reviewed the above documentation for accuracy and completeness, and I agree with the above.  Willeen Niece, MD

## 2023-04-04 DIAGNOSIS — K439 Ventral hernia without obstruction or gangrene: Secondary | ICD-10-CM | POA: Diagnosis not present

## 2023-04-17 DIAGNOSIS — Z79899 Other long term (current) drug therapy: Secondary | ICD-10-CM | POA: Diagnosis not present

## 2023-04-17 DIAGNOSIS — I480 Paroxysmal atrial fibrillation: Secondary | ICD-10-CM | POA: Diagnosis not present

## 2023-04-17 DIAGNOSIS — Z7985 Long-term (current) use of injectable non-insulin antidiabetic drugs: Secondary | ICD-10-CM | POA: Diagnosis not present

## 2023-04-17 DIAGNOSIS — I4891 Unspecified atrial fibrillation: Secondary | ICD-10-CM | POA: Diagnosis not present

## 2023-04-17 DIAGNOSIS — Z9049 Acquired absence of other specified parts of digestive tract: Secondary | ICD-10-CM | POA: Diagnosis not present

## 2023-04-17 DIAGNOSIS — I10 Essential (primary) hypertension: Secondary | ICD-10-CM | POA: Diagnosis not present

## 2023-04-17 DIAGNOSIS — Z9884 Bariatric surgery status: Secondary | ICD-10-CM | POA: Diagnosis not present

## 2023-04-17 DIAGNOSIS — K439 Ventral hernia without obstruction or gangrene: Secondary | ICD-10-CM | POA: Diagnosis not present

## 2023-04-17 DIAGNOSIS — K432 Incisional hernia without obstruction or gangrene: Secondary | ICD-10-CM | POA: Diagnosis not present

## 2023-04-17 DIAGNOSIS — Z85038 Personal history of other malignant neoplasm of large intestine: Secondary | ICD-10-CM | POA: Diagnosis not present

## 2023-04-17 DIAGNOSIS — K219 Gastro-esophageal reflux disease without esophagitis: Secondary | ICD-10-CM | POA: Diagnosis not present

## 2023-04-17 DIAGNOSIS — G8918 Other acute postprocedural pain: Secondary | ICD-10-CM | POA: Diagnosis not present

## 2023-04-24 DIAGNOSIS — R7309 Other abnormal glucose: Secondary | ICD-10-CM | POA: Diagnosis not present

## 2023-04-26 NOTE — Progress Notes (Unsigned)
  Electrophysiology Office Note:   Date:  04/27/2023  ID:  Jessica Moody, DOB 22-Jun-1965, MRN [id number]  Primary Cardiologist: None Electrophysiologist: Will Jorja Loa, MD      History of Present Illness:   Jessica Moody is a 58 y.o. female with h/o PAF, HTN, and chronic systolic CHF seen today for routine electrophysiology followup.   Since last being seen in our clinic the patient reports doing well overall. She had one episode in the past year of AF that lasted about 8 hours by symptom and apple watch. Otherwise,  she denies chest pain, dyspnea, PND, orthopnea, nausea, vomiting, dizziness, syncope, edema, weight gain, or early satiety.   Review of systems complete and found to be negative unless listed in HPI.   EP Information / Studies Reviewed:    EKG is ordered today. Personal review as below.  EKG Interpretation Date/Time:  Friday April 27 2023 12:03:33 EDT Ventricular Rate:  62 PR Interval:  134 QRS Duration:  88 QT Interval:  394 QTC Calculation: 399 R Axis:   42  Text Interpretation: Normal sinus rhythm with sinus arrhythmia Normal ECG No previous ECGs available Confirmed by Maxine Glenn [phone]) on 04/27/2023 1:10:21 PM    Arrhythmia/Device History No specialty comments available.   Physical Exam:   VS:  BP (!) 94/58 (BP Location: Left Arm, Patient Position: Sitting)   Pulse 62   Resp 16   Ht 5\' 4"  (1.626 m)   Wt 146 lb 12.8 oz (66.6 kg)   LMP 09/29/2015   SpO2 98%   BMI 25.20 kg/m    Wt Readings from Last 3 Encounters:  04/27/23 146 lb 12.8 oz (66.6 kg)  02/09/22 170 lb (77.1 kg)  02/02/21 197 lb 6.4 oz (89.5 kg)     GEN: No acute distress NECK: No JVD; No carotid bruits CARDIAC: Regular rate and rhythm, no murmurs, rubs, gallops RESPIRATORY:  Clear to auscultation without rales, wheezing or rhonchi  ABDOMEN: Soft, non-tender, non-distended EXTREMITIES:  No edema; No deformity   ASSESSMENT AND PLAN:    Paroxysmal AF EKG today shows NSR Not on OAC  with CHA2DS2/VASc 2 (EF normal) Updating Echo as below.  Pt would likely prefer medication prior to ablation consideration based on today's discussion.   HTN Stable on current regimen   HFrecEF Cardiac Murmur Echo 05/2019 LVEF 50-55% SEM noted on exam today, previous echo with only trivial mitral and aortic disease. Will update complete echo.      Follow up with Dr. Elberta Fortis in 12 months.   Pt understands if she were to develop an additional risk factor OR have an episode of AF > 24 hours the recommendation would likely be to resume OAC and have a visit for further planning/discussion.   Signed, Graciella Freer, PA-C

## 2023-04-27 ENCOUNTER — Ambulatory Visit: Attending: Internal Medicine | Admitting: Student

## 2023-04-27 ENCOUNTER — Encounter: Payer: Self-pay | Admitting: Student

## 2023-04-27 VITALS — BP 94/58 | HR 62 | Resp 16 | Ht 64.0 in | Wt 146.8 lb

## 2023-04-27 DIAGNOSIS — R011 Cardiac murmur, unspecified: Secondary | ICD-10-CM

## 2023-04-27 DIAGNOSIS — I10 Essential (primary) hypertension: Secondary | ICD-10-CM | POA: Diagnosis not present

## 2023-04-27 DIAGNOSIS — I480 Paroxysmal atrial fibrillation: Secondary | ICD-10-CM | POA: Diagnosis not present

## 2023-04-27 NOTE — Patient Instructions (Signed)
 Medication Instructions:  Your physician recommends that you continue on your current medications as directed. Please refer to the Current Medication list given to you today.  *If you need a refill on your cardiac medications before your next appointment, please call your pharmacy*  Lab Work: None ordered If you have labs (blood work) drawn today and your tests are completely normal, you will receive your results only by: MyChart Message (if you have MyChart) OR A paper copy in the mail If you have any lab test that is abnormal or we need to change your treatment, we will call you to review the results.  Testing/Procedures: Your physician has requested that you have an echocardiogram. Echocardiography is a painless test that uses sound waves to create images of your heart. It provides your doctor with information about the size and shape of your heart and how well your heart's chambers and valves are working. This procedure takes approximately one hour. There are no restrictions for this procedure. Please do NOT wear cologne, perfume, aftershave, or lotions (deodorant is allowed). Please arrive 15 minutes prior to your appointment time.  Please note: We ask at that you not bring children with you during ultrasound (echo/ vascular) testing. Due to room size and safety concerns, children are not allowed in the ultrasound rooms during exams. Our front office staff cannot provide observation of children in our lobby area while testing is being conducted. An adult accompanying a patient to their appointment will only be allowed in the ultrasound room at the discretion of the ultrasound technician under special circumstances. We apologize for any inconvenience.   Follow-Up: At [facility], you and your health needs are our priority.  As part of our continuing mission to provide you with exceptional heart care, our providers are all part of one team.  This team includes your primary  Cardiologist (physician) and Advanced Practice Providers or APPs (Physician Assistants and Nurse Practitioners) who all work together to provide you with the care you need, when you need it.  Your next appointment:   1 year(s)  Provider:   Loman Brooklyn, MD      1st Floor: - Lobby - Registration  - Pharmacy  - Lab - Cafe  2nd Floor: - PV Lab - Diagnostic Testing (echo, CT, nuclear med)  3rd Floor: - Vacant  4th Floor: - TCTS (cardiothoracic surgery) - AFib Clinic - Structural Heart Clinic - Vascular Surgery  - Vascular Ultrasound  5th Floor: - [facility] Cardiology (general and EP) - Clinical Pharmacy for coumadin, hypertension, lipid, weight-loss medications, and med management appointments    Valet parking services will be available as well.

## 2023-05-08 ENCOUNTER — Other Ambulatory Visit: Payer: Self-pay

## 2023-05-08 MED ORDER — LOSARTAN POTASSIUM 25 MG PO TABS: 25.0000 mg | ORAL_TABLET | Freq: Every day | ORAL | 3 refills | Status: AC

## 2023-05-23 ENCOUNTER — Ambulatory Visit: Attending: Student

## 2023-05-23 DIAGNOSIS — R011 Cardiac murmur, unspecified: Secondary | ICD-10-CM | POA: Diagnosis not present

## 2023-05-23 DIAGNOSIS — I480 Paroxysmal atrial fibrillation: Secondary | ICD-10-CM | POA: Diagnosis not present

## 2023-05-23 LAB — ECHOCARDIOGRAM COMPLETE
AR max vel: 1.85 cm2
AV Area VTI: 1.87 cm2
AV Area mean vel: 1.86 cm2
AV Mean grad: 7 mmHg
AV Peak grad: 13.4 mmHg
Ao pk vel: 1.83 m/s
Area-P 1/2: 3.42 cm2
Calc EF: 51.9 %
P 1/2 time: 678 ms
S' Lateral: 4 cm
Single Plane A2C EF: 57.1 %
Single Plane A4C EF: 50.8 %

## 2023-05-24 DIAGNOSIS — R7309 Other abnormal glucose: Secondary | ICD-10-CM | POA: Diagnosis not present

## 2023-06-12 ENCOUNTER — Ambulatory Visit: Payer: BC Managed Care – PPO | Admitting: Dermatology

## 2023-06-12 DIAGNOSIS — L821 Other seborrheic keratosis: Secondary | ICD-10-CM

## 2023-06-12 DIAGNOSIS — Z1283 Encounter for screening for malignant neoplasm of skin: Secondary | ICD-10-CM | POA: Diagnosis not present

## 2023-06-12 DIAGNOSIS — D1801 Hemangioma of skin and subcutaneous tissue: Secondary | ICD-10-CM

## 2023-06-12 DIAGNOSIS — S80861A Insect bite (nonvenomous), right lower leg, initial encounter: Secondary | ICD-10-CM

## 2023-06-12 DIAGNOSIS — Z7189 Other specified counseling: Secondary | ICD-10-CM

## 2023-06-12 DIAGNOSIS — B351 Tinea unguium: Secondary | ICD-10-CM

## 2023-06-12 DIAGNOSIS — L814 Other melanin hyperpigmentation: Secondary | ICD-10-CM

## 2023-06-12 DIAGNOSIS — S80862A Insect bite (nonvenomous), left lower leg, initial encounter: Secondary | ICD-10-CM

## 2023-06-12 DIAGNOSIS — Z79899 Other long term (current) drug therapy: Secondary | ICD-10-CM

## 2023-06-12 DIAGNOSIS — W57XXXA Bitten or stung by nonvenomous insect and other nonvenomous arthropods, initial encounter: Secondary | ICD-10-CM

## 2023-06-12 DIAGNOSIS — D229 Melanocytic nevi, unspecified: Secondary | ICD-10-CM

## 2023-06-12 DIAGNOSIS — S80869A Insect bite (nonvenomous), unspecified lower leg, initial encounter: Secondary | ICD-10-CM

## 2023-06-12 DIAGNOSIS — D225 Melanocytic nevi of trunk: Secondary | ICD-10-CM

## 2023-06-12 DIAGNOSIS — L578 Other skin changes due to chronic exposure to nonionizing radiation: Secondary | ICD-10-CM | POA: Diagnosis not present

## 2023-06-12 DIAGNOSIS — W908XXA Exposure to other nonionizing radiation, initial encounter: Secondary | ICD-10-CM

## 2023-06-12 MED ORDER — TAVABOROLE 5 % EX SOLN: CUTANEOUS | 3 refills | Status: AC

## 2023-06-12 MED ORDER — HYDROQUINONE 4 % EX CREA: TOPICAL_CREAM | CUTANEOUS | 2 refills | Status: AC

## 2023-06-12 NOTE — Progress Notes (Signed)
 Follow-Up Visit   Subjective  Jessica Moody is a 58 y.o. female who presents for the following: Skin Cancer Screening and Full Body Skin Exam  The patient presents for Total-Body Skin Exam (TBSE) for skin cancer screening and mole check. The patient has spots, moles and lesions to be evaluated, some may be new or changing. She has a possible recurrence of fungus on toenails, improved in past with Kerydin  solution. She would like a refill. No history of skin cancer.     The following portions of the chart were reviewed this encounter and updated as appropriate: medications, allergies, medical history  Review of Systems:  No other skin or systemic complaints except as noted in HPI or Assessment and Plan.  Objective  Well appearing patient in no apparent distress; mood and affect are within normal limits.  A full examination was performed including scalp, head, eyes, ears, nose, lips, neck, chest, axillae, abdomen, back, buttocks, bilateral upper extremities, bilateral lower extremities, hands, feet, fingers, toes, fingernails, and toenails. All findings within normal limits unless otherwise noted below.   Relevant physical exam findings are noted in the Assessment and Plan.    Assessment & Plan   SKIN CANCER SCREENING PERFORMED TODAY.  ACTINIC DAMAGE - Chronic condition, secondary to cumulative UV/sun exposure - diffuse scaly erythematous macules with underlying dyspigmentation - Recommend daily broad spectrum sunscreen SPF 30+ to sun-exposed areas, reapply every 2 hours as needed.  - Staying in the shade or wearing long sleeves, sun glasses (UVA+UVB protection) and wide brim hats (4-inch brim around the entire circumference of the hat) are also recommended for sun protection.  - Call for new or changing lesions.  SEBORRHEIC KERATOSES, HEMANGIOMAS - Benign normal skin lesions - Benign-appearing - Call for any changes  LENTIGINES vs MELASMA  Exam: scattered tan macules at b/l  malar cheeks Due to sun exposure Treatment Plan: Benign-appearing, observe. Recommend daily broad spectrum sunscreen SPF 30+ to sun-exposed areas, reapply every 2 hours as needed.  Call for any changes  Start hydroquinone 4% cream Apply to cheeks for dark spots twice daily up to 3 months then d/c Continue daily sunscreen  Counseling for BBL / IPL / Laser and Coordination of Care Discussed the treatment option of Broad Band Light (BBL) /Intense Pulsed Light (IPL)/ Laser for skin discoloration, including brown spots and redness.  Typically we recommend at least 1-3 treatment sessions about 5-8 weeks apart for best results.  Cannot have tanned skin when BBL performed, and regular use of sunscreen/photoprotection is advised after the procedure to help maintain results. The patient's condition may also require "maintenance treatments" in the future.  The fee for BBL / laser treatments is $350 per treatment session for the whole face.  A fee can be quoted for other parts of the body.  Insurance typically does not pay for BBL/laser treatments and therefore the fee is an out-of-pocket cost. Recommend prophylactic valtrex treatment. Once scheduled for procedure, will send Rx in prior to patient's appointment.   BITE REACTION Exam: Pink edematous papule at right calf, left med knee  Benign, observe.    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 Condition is symptomatic/ bothersome to patient. Not currently at goal.  Treatment Plan: Restart Kerydin  solution apply nightly to toenails   MELANOCYTIC NEVI - Tan-brown and/or pink-flesh-colored symmetric macules and papules - R umbilicus 2 mm medium dark brown macule - Benign appearing on  exam today - Observation - Call clinic for new or changing moles - Recommend daily use of broad spectrum spf 30+ sunscreen to sun-exposed areas.    Return in  about 1 year (around 06/11/2024) for TBSE.  IBernardine Bridegroom, CMA, am acting as scribe for Artemio Larry, MD .   Documentation: I have reviewed the above documentation for accuracy and completeness, and I agree with the above.  Artemio Larry, MD

## 2023-06-12 NOTE — Patient Instructions (Signed)

## 2023-06-20 ENCOUNTER — Other Ambulatory Visit: Payer: Self-pay

## 2023-06-20 MED ORDER — METOPROLOL SUCCINATE ER 50 MG PO TB24: 50.0000 mg | ORAL_TABLET | Freq: Every day | ORAL | 3 refills | Status: AC

## 2023-06-24 DIAGNOSIS — R7309 Other abnormal glucose: Secondary | ICD-10-CM | POA: Diagnosis not present

## 2023-06-25 ENCOUNTER — Ambulatory Visit: Admitting: Dermatology

## 2023-07-02 DIAGNOSIS — H04123 Dry eye syndrome of bilateral lacrimal glands: Secondary | ICD-10-CM | POA: Diagnosis not present

## 2023-07-02 DIAGNOSIS — H18513 Endothelial corneal dystrophy, bilateral: Secondary | ICD-10-CM | POA: Diagnosis not present

## 2023-07-02 DIAGNOSIS — H1045 Other chronic allergic conjunctivitis: Secondary | ICD-10-CM | POA: Diagnosis not present

## 2023-07-04 DIAGNOSIS — Z9884 Bariatric surgery status: Secondary | ICD-10-CM | POA: Diagnosis not present

## 2023-07-04 DIAGNOSIS — E663 Overweight: Secondary | ICD-10-CM | POA: Diagnosis not present

## 2023-07-04 DIAGNOSIS — I10 Essential (primary) hypertension: Secondary | ICD-10-CM | POA: Diagnosis not present

## 2023-07-04 DIAGNOSIS — I4891 Unspecified atrial fibrillation: Secondary | ICD-10-CM | POA: Diagnosis not present

## 2023-07-04 DIAGNOSIS — K219 Gastro-esophageal reflux disease without esophagitis: Secondary | ICD-10-CM | POA: Diagnosis not present

## 2023-08-29 DIAGNOSIS — K219 Gastro-esophageal reflux disease without esophagitis: Secondary | ICD-10-CM | POA: Diagnosis not present

## 2023-08-29 DIAGNOSIS — I10 Essential (primary) hypertension: Secondary | ICD-10-CM | POA: Diagnosis not present

## 2023-08-29 DIAGNOSIS — I4891 Unspecified atrial fibrillation: Secondary | ICD-10-CM | POA: Diagnosis not present

## 2023-08-29 DIAGNOSIS — E663 Overweight: Secondary | ICD-10-CM | POA: Diagnosis not present

## 2023-09-12 DIAGNOSIS — Z9884 Bariatric surgery status: Secondary | ICD-10-CM | POA: Diagnosis not present

## 2023-09-12 DIAGNOSIS — Z833 Family history of diabetes mellitus: Secondary | ICD-10-CM | POA: Diagnosis not present

## 2023-09-12 DIAGNOSIS — Z6825 Body mass index (BMI) 25.0-25.9, adult: Secondary | ICD-10-CM | POA: Diagnosis not present

## 2023-09-12 DIAGNOSIS — E663 Overweight: Secondary | ICD-10-CM | POA: Diagnosis not present

## 2023-09-26 DIAGNOSIS — Z6825 Body mass index (BMI) 25.0-25.9, adult: Secondary | ICD-10-CM | POA: Diagnosis not present

## 2023-09-26 DIAGNOSIS — E663 Overweight: Secondary | ICD-10-CM | POA: Diagnosis not present

## 2023-09-26 DIAGNOSIS — Z9884 Bariatric surgery status: Secondary | ICD-10-CM | POA: Diagnosis not present

## 2023-09-26 DIAGNOSIS — Z833 Family history of diabetes mellitus: Secondary | ICD-10-CM | POA: Diagnosis not present

## 2023-10-10 DIAGNOSIS — Z6825 Body mass index (BMI) 25.0-25.9, adult: Secondary | ICD-10-CM | POA: Diagnosis not present

## 2023-10-10 DIAGNOSIS — E663 Overweight: Secondary | ICD-10-CM | POA: Diagnosis not present

## 2023-10-10 DIAGNOSIS — Z9884 Bariatric surgery status: Secondary | ICD-10-CM | POA: Diagnosis not present

## 2023-10-10 DIAGNOSIS — Z713 Dietary counseling and surveillance: Secondary | ICD-10-CM | POA: Diagnosis not present

## 2023-10-25 DIAGNOSIS — K219 Gastro-esophageal reflux disease without esophagitis: Secondary | ICD-10-CM | POA: Diagnosis not present

## 2023-10-25 DIAGNOSIS — Z9884 Bariatric surgery status: Secondary | ICD-10-CM | POA: Diagnosis not present

## 2023-10-25 DIAGNOSIS — E663 Overweight: Secondary | ICD-10-CM | POA: Diagnosis not present

## 2023-10-25 DIAGNOSIS — I10 Essential (primary) hypertension: Secondary | ICD-10-CM | POA: Diagnosis not present

## 2023-10-25 DIAGNOSIS — Z6825 Body mass index (BMI) 25.0-25.9, adult: Secondary | ICD-10-CM | POA: Diagnosis not present

## 2023-10-25 DIAGNOSIS — I4891 Unspecified atrial fibrillation: Secondary | ICD-10-CM | POA: Diagnosis not present

## 2023-11-14 DIAGNOSIS — Z9884 Bariatric surgery status: Secondary | ICD-10-CM | POA: Diagnosis not present

## 2023-11-14 DIAGNOSIS — Z6825 Body mass index (BMI) 25.0-25.9, adult: Secondary | ICD-10-CM | POA: Diagnosis not present

## 2023-11-14 DIAGNOSIS — E663 Overweight: Secondary | ICD-10-CM | POA: Diagnosis not present

## 2023-12-04 DIAGNOSIS — Z6825 Body mass index (BMI) 25.0-25.9, adult: Secondary | ICD-10-CM | POA: Diagnosis not present

## 2023-12-04 DIAGNOSIS — Z9884 Bariatric surgery status: Secondary | ICD-10-CM | POA: Diagnosis not present

## 2023-12-04 DIAGNOSIS — Z713 Dietary counseling and surveillance: Secondary | ICD-10-CM | POA: Diagnosis not present

## 2023-12-04 DIAGNOSIS — E663 Overweight: Secondary | ICD-10-CM | POA: Diagnosis not present

## 2023-12-18 DIAGNOSIS — Z6825 Body mass index (BMI) 25.0-25.9, adult: Secondary | ICD-10-CM | POA: Diagnosis not present

## 2023-12-18 DIAGNOSIS — Z713 Dietary counseling and surveillance: Secondary | ICD-10-CM | POA: Diagnosis not present

## 2023-12-18 DIAGNOSIS — Z9884 Bariatric surgery status: Secondary | ICD-10-CM | POA: Diagnosis not present

## 2023-12-18 DIAGNOSIS — E663 Overweight: Secondary | ICD-10-CM | POA: Diagnosis not present

## 2024-04-28 ENCOUNTER — Ambulatory Visit: Admitting: Cardiology

## 2024-06-17 ENCOUNTER — Encounter: Admitting: Dermatology
